# Patient Record
Sex: Female | Born: 1970 | Race: Black or African American | Hispanic: No | Marital: Single | Smoking: Never smoker
Health system: Southern US, Community
[De-identification: ages and names within clinical notes are randomized; demographics above are authoritative.]

## PROBLEM LIST (undated history)

## (undated) DIAGNOSIS — E119 Type 2 diabetes mellitus without complications: Secondary | ICD-10-CM

## (undated) DIAGNOSIS — F32A Depression, unspecified: Secondary | ICD-10-CM

## (undated) DIAGNOSIS — D649 Anemia, unspecified: Secondary | ICD-10-CM

## (undated) DIAGNOSIS — E785 Hyperlipidemia, unspecified: Secondary | ICD-10-CM

## (undated) DIAGNOSIS — F329 Major depressive disorder, single episode, unspecified: Secondary | ICD-10-CM

## (undated) DIAGNOSIS — N7011 Chronic salpingitis: Secondary | ICD-10-CM

## (undated) DIAGNOSIS — N920 Excessive and frequent menstruation with regular cycle: Secondary | ICD-10-CM

## (undated) HISTORY — DX: Hyperlipidemia, unspecified: E78.5

## (undated) HISTORY — DX: Chronic salpingitis: N70.11

## (undated) HISTORY — DX: Type 2 diabetes mellitus without complications: E11.9

## (undated) HISTORY — DX: Major depressive disorder, single episode, unspecified: F32.9

## (undated) HISTORY — DX: Anemia, unspecified: D64.9

## (undated) HISTORY — DX: Depression, unspecified: F32.A

## (undated) HISTORY — DX: Excessive and frequent menstruation with regular cycle: N92.0

## (undated) HISTORY — PX: OVARIAN CYST REMOVAL: SHX89

---

## 2007-03-18 HISTORY — PX: BREAST BIOPSY: SHX20

## 2012-02-14 ENCOUNTER — Emergency Department (HOSPITAL_COMMUNITY)
Admission: EM | Admit: 2012-02-14 | Discharge: 2012-02-14 | Disposition: A | Discharge status: Home / Self Care | Payer: No Typology Code available for payment source | Attending: Emergency Medicine | Admitting: Emergency Medicine

## 2012-02-14 ENCOUNTER — Emergency Department (HOSPITAL_COMMUNITY): Payer: No Typology Code available for payment source

## 2012-02-14 ENCOUNTER — Encounter (HOSPITAL_COMMUNITY): Payer: Self-pay | Admitting: Emergency Medicine

## 2012-02-14 DIAGNOSIS — Y9389 Activity, other specified: Secondary | ICD-10-CM | POA: Insufficient documentation

## 2012-02-14 DIAGNOSIS — S46909A Unspecified injury of unspecified muscle, fascia and tendon at shoulder and upper arm level, unspecified arm, initial encounter: Secondary | ICD-10-CM | POA: Insufficient documentation

## 2012-02-14 DIAGNOSIS — S335XXA Sprain of ligaments of lumbar spine, initial encounter: Secondary | ICD-10-CM | POA: Insufficient documentation

## 2012-02-14 DIAGNOSIS — S39012A Strain of muscle, fascia and tendon of lower back, initial encounter: Secondary | ICD-10-CM

## 2012-02-14 DIAGNOSIS — S161XXA Strain of muscle, fascia and tendon at neck level, initial encounter: Secondary | ICD-10-CM

## 2012-02-14 DIAGNOSIS — S4980XA Other specified injuries of shoulder and upper arm, unspecified arm, initial encounter: Secondary | ICD-10-CM | POA: Insufficient documentation

## 2012-02-14 DIAGNOSIS — S139XXA Sprain of joints and ligaments of unspecified parts of neck, initial encounter: Secondary | ICD-10-CM | POA: Insufficient documentation

## 2012-02-14 DIAGNOSIS — Y92410 Unspecified street and highway as the place of occurrence of the external cause: Secondary | ICD-10-CM | POA: Insufficient documentation

## 2012-02-14 MED ORDER — IBUPROFEN 400 MG PO TABS
800.0000 mg | ORAL_TABLET | Freq: Once | ORAL | Status: AC
  Administered 2012-02-14: 800 mg via ORAL
  Filled 2012-02-14: qty 2

## 2012-02-14 MED ORDER — HYDROCODONE-ACETAMINOPHEN 5-500 MG PO TABS: 1.0000 | ORAL_TABLET | Freq: Four times a day (QID) | ORAL | Status: DC | PRN

## 2012-02-14 MED ORDER — IBUPROFEN 600 MG PO TABS: 600.0000 mg | ORAL_TABLET | Freq: Four times a day (QID) | ORAL | Status: DC | PRN
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
## 2012-02-14 NOTE — ED Notes (Signed)
Patient discharged with her son instructions given using teach back method. Patient verbalized an understanding

## 2012-04-06 ENCOUNTER — Emergency Department (HOSPITAL_COMMUNITY)
Admission: EM | Admit: 2012-04-06 | Discharge: 2012-04-06 | Disposition: A | Discharge status: Home / Self Care | Payer: Self-pay | Attending: Emergency Medicine | Admitting: Emergency Medicine

## 2012-04-06 ENCOUNTER — Encounter (HOSPITAL_COMMUNITY): Payer: Self-pay | Admitting: Adult Health

## 2012-04-06 ENCOUNTER — Emergency Department (HOSPITAL_COMMUNITY): Payer: Self-pay

## 2012-04-06 DIAGNOSIS — R5381 Other malaise: Secondary | ICD-10-CM | POA: Insufficient documentation

## 2012-04-06 DIAGNOSIS — R209 Unspecified disturbances of skin sensation: Secondary | ICD-10-CM | POA: Insufficient documentation

## 2012-04-06 DIAGNOSIS — R200 Anesthesia of skin: Secondary | ICD-10-CM

## 2012-04-06 DIAGNOSIS — G44209 Tension-type headache, unspecified, not intractable: Secondary | ICD-10-CM | POA: Insufficient documentation

## 2012-04-06 DIAGNOSIS — Z7982 Long term (current) use of aspirin: Secondary | ICD-10-CM | POA: Insufficient documentation

## 2012-04-06 MED ORDER — KETOROLAC TROMETHAMINE 30 MG/ML IJ SOLN
30.0000 mg | Freq: Once | INTRAMUSCULAR | Status: AC
  Administered 2012-04-06: 30 mg via INTRAVENOUS
  Filled 2012-04-06: qty 1

## 2012-04-06 MED ORDER — LORAZEPAM 2 MG/ML IJ SOLN
1.0000 mg | Freq: Once | INTRAMUSCULAR | Status: AC
  Administered 2012-04-06: 1 mg via INTRAVENOUS
  Filled 2012-04-06: qty 1

## 2012-04-06 MED ORDER — DEXAMETHASONE SODIUM PHOSPHATE 10 MG/ML IJ SOLN
10.0000 mg | Freq: Once | INTRAMUSCULAR | Status: AC
  Administered 2012-04-06: 10 mg via INTRAVENOUS
  Filled 2012-04-06: qty 1

## 2012-04-06 MED ORDER — SODIUM CHLORIDE 0.9 % IV BOLUS (SEPSIS)
1000.0000 mL | Freq: Once | INTRAVENOUS | Status: AC
  Administered 2012-04-06: 1000 mL via INTRAVENOUS

## 2012-04-06 NOTE — ED Notes (Signed)
Pt states she has had a "tension headache" since 4 pm today.  Since this time, also c/o "numbness" in the right wrist, arm, and face.  HA is on the right side as well.  Denies photophobia.

## 2012-04-06 NOTE — ED Provider Notes (Signed)
History     CSN: [id number]  Arrival date & time 04/06/12  1843   First MD Initiated Contact with Patient 04/06/12 2048      Chief Complaint  Patient presents with  . Numbness    (Consider location/radiation/quality/duration/timing/severity/associated sxs/prior treatment) HPI Comments: 41 year old female presents emergency department complaining of right-sided facial numbness around 4:00 this afternoon while she was watching TV. The numbness lasted about 45 minutes and gradually progressed and her right arm and right leg which is still present. She describes the numbness in her arm and leg as as if they were asleep. Arm and leg are feeling a little weak. Admits to associated tension headache which she has a history of. Describes the headache as throbbing, rated 5 out of 5. She took an aspirin earlier with mild relief. Denies visual disturbance, dizziness, lightheadedness, nausea, vomiting, confusion or speech difficulty. Denies ever having any symptoms like this in the past. No personal or family history of stroke or heart attack.  The history is provided by the patient.    History reviewed. No pertinent past medical history.  History reviewed. No pertinent past surgical history.  History reviewed. No pertinent family history.  History  Substance Use Topics  . Smoking status: Never Smoker   . Smokeless tobacco: Not on file  . Alcohol Use: No    OB History    Grav Para Term Preterm Abortions TAB SAB Ect Mult Living                  Review of Systems  Constitutional: Negative for fever, chills and diaphoresis.  HENT: Negative for neck pain and neck stiffness.   Eyes: Negative for visual disturbance.  Respiratory: Negative for shortness of breath.   Cardiovascular: Negative for chest pain.  Gastrointestinal: Negative for nausea and vomiting.  Musculoskeletal: Negative for back pain.  Neurological: Positive for weakness, numbness and headaches. Negative for dizziness,  speech difficulty and light-headedness.  Psychiatric/Behavioral: Negative for confusion.  All other systems reviewed and are negative.    Allergies  Peanuts  Home Medications   Current Outpatient Rx  Name  Route  Sig  Dispense  Refill  . ASPIRIN PO   Oral   Take 1 tablet by mouth once.           BP 101/73  Pulse 63  Temp 98.2 F (36.8 C) (Oral)  Resp 16  SpO2 100%  Physical Exam  Nursing note and vitals reviewed. Constitutional: She is oriented to person, place, and time. She appears well-developed and well-nourished. No distress.  HENT:  Head: Normocephalic and atraumatic.  Mouth/Throat: Uvula is midline and oropharynx is clear and moist.  Eyes: Conjunctivae normal and EOM are normal. Pupils are equal, round, and reactive to light.  Neck: Normal range of motion. Neck supple.  Cardiovascular: Normal rate, regular rhythm, normal heart sounds and intact distal pulses.   No murmur heard. Pulmonary/Chest: Effort normal and breath sounds normal. No respiratory distress.  Abdominal: Soft. Bowel sounds are normal. There is no tenderness.  Musculoskeletal: Normal range of motion. She exhibits no edema.  Neurological: She is alert and oriented to person, place, and time. She has normal strength. No cranial nerve deficit or sensory deficit. She displays a negative Romberg sign. Coordination and gait normal.  Skin: Skin is warm and dry. She is not diaphoretic.  Psychiatric: She has a normal mood and affect. Her behavior is normal.    ED Course  Procedures (including critical care time)  Labs Reviewed -  No data to display Ct Head Wo Contrast  04/06/2012  *RADIOLOGY REPORT*  Clinical Data: Tension headache.  Right wrist, arm and face numbness.  CT HEAD WITHOUT CONTRAST  Technique:  Contiguous axial images were obtained from the base of the skull through the vertex without contrast.  Comparison: None.  Findings: There is no evidence of acute infarction, mass lesion, or intra- or  extra-axial hemorrhage on CT.  The posterior fossa, including the cerebellum, brainstem and fourth ventricle, is within normal limits.  The third and lateral ventricles, and basal ganglia are unremarkable in appearance.  The cerebral hemispheres are symmetric in appearance, with normal gray- white differentiation.  No mass effect or midline shift is seen.  There is no evidence of fracture; visualized osseous structures are unremarkable in appearance.  The visualized portions of the orbits are within normal limits.  The paranasal sinuses and mastoid air cells are well-aerated.  No significant soft tissue abnormalities are seen.  IMPRESSION: Unremarkable noncontrast CT of the head.   Original Report Authenticated By: Tonia Ghent, M.D.      1. Tension headache   2. Numbness       MDM  42 y/o female with tension headache and right sided facial/arm/leg numbness. Neuro exam unremarkable. CT head obtained which was normal. Headache and numbness completely subsided after receiving IV fluids, toradol, decadron and ativan. Patient stable for discharge. She is in NAD. Case discussed with Dr. Juleen China who agrees with plan of care. Return precautions discussed. Patient states understanding of plan and is agreeable.         Trevor Mace, PA-C 04/06/12 2251

## 2012-04-06 NOTE — ED Notes (Signed)
Rx x 0.  Pt voiced understanding to f/u with PCP and return for worsening condition.  

## 2012-04-06 NOTE — ED Notes (Signed)
Reports Right sided facial numbness that began at 4 pm today progressed to right arm, right leg. No drift, no droop, equal grips bilaterally, denies blurred vision and dizziness. Able to ambulate without difficulty, answers all questions appropriately. Pt reports tension headache. Denies chest pain and SOB.

## 2012-04-09 NOTE — ED Provider Notes (Signed)
Medical screening examination/treatment/procedure(s) were performed by non-physician practitioner and as supervising physician I was immediately available for consultation/collaboration.  Raeford Razor, MD 04/09/12 [phone]

## 2012-06-29 ENCOUNTER — Encounter (HOSPITAL_COMMUNITY): Payer: Self-pay | Admitting: *Deleted

## 2012-06-29 ENCOUNTER — Emergency Department (HOSPITAL_COMMUNITY)
Admission: EM | Admit: 2012-06-29 | Discharge: 2012-06-29 | Disposition: A | Discharge status: Home / Self Care | Payer: Medicare HMO | Attending: Emergency Medicine | Admitting: Emergency Medicine

## 2012-06-29 ENCOUNTER — Emergency Department (HOSPITAL_COMMUNITY): Payer: Medicare HMO

## 2012-06-29 DIAGNOSIS — Z7982 Long term (current) use of aspirin: Secondary | ICD-10-CM | POA: Insufficient documentation

## 2012-06-29 DIAGNOSIS — R0789 Other chest pain: Secondary | ICD-10-CM

## 2012-06-29 DIAGNOSIS — R071 Chest pain on breathing: Secondary | ICD-10-CM | POA: Insufficient documentation

## 2012-06-29 LAB — COMPREHENSIVE METABOLIC PANEL
ALT: 18 U/L (ref 0–35)
AST: 22 U/L (ref 0–37)
Albumin: 3.3 g/dL — ABNORMAL LOW (ref 3.5–5.2)
CO2: 27 mEq/L (ref 19–32)
Calcium: 8.8 mg/dL (ref 8.4–10.5)
Chloride: 105 mEq/L (ref 96–112)
GFR calc non Af Amer: 89 mL/min — ABNORMAL LOW (ref >90)
Sodium: 140 mEq/L (ref 135–145)

## 2012-06-29 LAB — CBC WITH DIFFERENTIAL/PLATELET
Basophils Absolute: 0 10*3/uL (ref 0.0–0.1)
Basophils Relative: 1 % (ref 0–1)
Eosinophils Relative: 3 % (ref 0–5)
Lymphocytes Relative: 44 % (ref 12–46)
MCHC: 31.9 g/dL (ref 30.0–36.0)
Neutro Abs: 1.9 10*3/uL (ref 1.7–7.7)
Platelets: 277 10*3/uL (ref 150–400)
RDW: 15.7 % — ABNORMAL HIGH (ref 11.5–15.5)
WBC: 4.4 10*3/uL (ref 4.0–10.5)

## 2012-06-29 MED ORDER — HYDROCODONE-ACETAMINOPHEN 5-325 MG PO TABS: 2.0000 | ORAL_TABLET | Freq: Three times a day (TID) | ORAL | Status: DC | PRN

## 2012-06-29 NOTE — ED Notes (Signed)
Patient returned from xray.

## 2012-06-29 NOTE — ED Notes (Signed)
Chest pain is in left breast, nonradiating.

## 2012-06-29 NOTE — ED Provider Notes (Signed)
History     CSN: [id number]  Arrival date & time 06/29/12  2128   First MD Initiated Contact with Patient 06/29/12 2215      Chief Complaint  Patient presents with  . Chest Pain    (Consider location/radiation/quality/duration/timing/severity/associated sxs/prior treatment) HPI This 42 year old healthy female has a 2-3 days of a constant well localized nonradiating sharp stabbing left anterior chest discomfort without radiation or associated symptoms such as fever cough shortness breath abdominal pain sweats vomiting bloody stools or other concerns. It is worse with palpation and nonexertional and nonpleuritic. There is no treatment prior to arrival other than aspirin today. She has had some mild nausea. PERC negative. History reviewed. No pertinent past medical history.  History reviewed. No pertinent past surgical history. Tubal ligation No family history on file.  History  Substance Use Topics  . Smoking status: Never Smoker   . Smokeless tobacco: Not on file  . Alcohol Use: No    OB History   Grav Para Term Preterm Abortions TAB SAB Ect Mult Living                  Review of Systems 10 Systems reviewed and are negative for acute change except as noted in the HPI. Allergies  Peanuts  Home Medications   Current Outpatient Rx  Name  Route  Sig  Dispense  Refill  . aspirin EC 81 MG tablet   Oral   Take 81 mg by mouth daily.         . diphenhydrAMINE (BENADRYL) 25 mg capsule   Oral   Take 25 mg by mouth every 6 (six) hours as needed for itching.         Marland Kitchen HYDROcodone-acetaminophen (NORCO) 5-325 MG per tablet   Oral   Take 2 tablets by mouth every 8 (eight) hours as needed for pain.   10 tablet   0     BP 108/64  Pulse 63  Temp(Src) 98.3 F (36.8 C) (Oral)  Resp 17  SpO2 99%  Physical Exam  Nursing note and vitals reviewed. Constitutional:  Awake, alert, nontoxic appearance.  HENT:  Head: Atraumatic.  Eyes: Right eye exhibits no discharge.  Left eye exhibits no discharge.  Neck: Neck supple.  Cardiovascular: Normal rate and regular rhythm.   No murmur heard. Pulmonary/Chest: Effort normal and breath sounds normal. No respiratory distress. She has no wheezes. She has no rales. She exhibits tenderness.  Exactly reproducible left parasternal anterior chest wall tenderness without rash  Abdominal: Soft. There is no tenderness. There is no rebound.  Musculoskeletal: She exhibits no edema and no tenderness.  Baseline ROM, no obvious new focal weakness.  Neurological:  Mental status and motor strength appears baseline for patient and situation.  Skin: No rash noted.  Psychiatric: She has a normal mood and affect.    ED Course  Procedures (including critical care time) ECG: Normal sinus rhythm, ventricular rate 86, normal axis, no acute ischemic changes noted, no comparison ECG immediately available  Patient informed of clinical course, understands medical decision-making process, and agree with plan.  Labs Reviewed  CBC WITH DIFFERENTIAL - Abnormal; Notable for the following:    RBC 3.71 (*)    Hemoglobin 9.4 (*)    HCT 29.5 (*)    MCH 25.3 (*)    RDW 15.7 (*)    All other components within normal limits  COMPREHENSIVE METABOLIC PANEL - Abnormal; Notable for the following:    Glucose, Bld 133 (*)  Albumin 3.3 (*)    Total Bilirubin 0.2 (*)    GFR calc non Af Amer 89 (*)    All other components within normal limits  TROPONIN I   Dg Chest 2 View  06/29/2012  *RADIOLOGY REPORT*  Clinical Data: Left-sided chest pain for 3 days.  CHEST - 2 VIEW  Comparison: None.  Findings: The lungs are well-aerated and clear.  There is no evidence of focal opacification, pleural effusion or pneumothorax.  The heart is normal in size; the mediastinal contour is within normal limits.  No acute osseous abnormalities are seen.  IMPRESSION: No acute cardiopulmonary process seen.   Original Report Authenticated By: Tonia Ghent, M.D.       1. Chest wall pain       MDM  I doubt any other [facility] precluding discharge at this time including, but not necessarily limited to the following:ACS, PE.        Hurman Horn, MD 06/30/12 [phone]

## 2012-06-29 NOTE — ED Notes (Signed)
The pt has had lt sided chest pain for 3 days with some intermittent sob and nausea/  No previous hsitory

## 2012-07-01 ENCOUNTER — Ambulatory Visit (INDEPENDENT_AMBULATORY_CARE_PROVIDER_SITE_OTHER): Payer: Medicare HMO | Admitting: Family Medicine

## 2012-07-01 ENCOUNTER — Encounter: Payer: Self-pay | Admitting: Family Medicine

## 2012-07-01 VITALS — BP 108/82 | HR 90 | Temp 98.5°F | Ht 65.75 in | Wt 234.0 lb

## 2012-07-01 DIAGNOSIS — D509 Iron deficiency anemia, unspecified: Secondary | ICD-10-CM

## 2012-07-01 DIAGNOSIS — N920 Excessive and frequent menstruation with regular cycle: Secondary | ICD-10-CM

## 2012-07-01 DIAGNOSIS — Z7689 Persons encountering health services in other specified circumstances: Secondary | ICD-10-CM

## 2012-07-01 DIAGNOSIS — F4323 Adjustment disorder with mixed anxiety and depressed mood: Secondary | ICD-10-CM

## 2012-07-01 DIAGNOSIS — E785 Hyperlipidemia, unspecified: Secondary | ICD-10-CM | POA: Insufficient documentation

## 2012-07-01 DIAGNOSIS — Z7189 Other specified counseling: Secondary | ICD-10-CM

## 2012-07-01 MED ORDER — FLUOXETINE HCL 20 MG PO TABS: 20.0000 mg | ORAL_TABLET | Freq: Every day | ORAL | Status: DC

## 2012-07-01 NOTE — Patient Instructions (Addendum)
-  We have ordered labs or studies at this visit. It can take up to 1-2 weeks for results and processing. We will contact you with instructions IF your results are abnormal. Normal results will be released to your [facility]. If you have not heard from [place] or can not find your results in [facility] in 2 weeks please contact our office.  -PLEASE SIGN UP FOR MYCHART TODAY   We recommend the following healthy lifestyle measures: - eat a healthy diet consisting of lots of vegetables, fruits, beans, nuts, seeds, healthy meats such as white chicken and fish and whole grains.  - avoid fried foods, fast food, processed foods, sodas, red meet and other fattening foods.  - get a least 150 minutes of aerobic exercise per week.   Start the prozac daily  Get counseling  Schedule appointment with gynecologist for your anemia and heavy bleeding  Take iron supplement daily  Follow up in: 1 month

## 2012-07-01 NOTE — Progress Notes (Signed)
Chief Complaint  Patient presents with  . Establish Care    HPI:  Amanda Bridges is here to establish care. Recently moved here from [place], [place].  Last PCP and physical: last physical with pap was in [place] in spring 2013, mammo oct 2012.  Has the following chronic problems and concerns today:  Patient Active Problem List  Diagnosis  . Anemia, iron deficiency  . Heavy menstrual bleeding  . Hyperlipemia  . Adjustment reaction with anxiety and depression   Wants to get basic labs - has hx of HLD, also has FH of diabetes and wants to check for diabetes too.  Has history of iron def anemia on and off chronically: -has very heavy menstrual bleeding -periods last 6 days, has to change large pads sometimes as soon as every 10 minutes, has clots as well -reports had workup in the past and has taken iron in the past -FDLMP: 06/26/11 - just finished menstrual cycle, periods monthly and regular, not spotting between periods -seen in ED for costochondritis a few days ago and Hgb 9.4 -denies: bowel issues, changes in bowels, blood in stools, no bleeding disorders in family or herself, palpitation, SOB, dizziness, easy bruising -wants to establish with gyn for treatment  Depression/Anxiety: -son murdered in 2013 -intermittent anxiety, heaviness, sadness, crying -no SI  Health Maintenance: -had health maintenance exam about 1 year ago, mammo 2 years ago  ROS: See pertinent positives and negatives per HPI.  Past Medical History  Diagnosis Date  . Depression   . Anemia   . Heavy menstrual bleeding   . Hyperlipidemia     Family History  Problem Relation Age of Onset  . Cancer Mother     lung cancer, smoker  . Diabetes Mother   . Diabetes Maternal Aunt   . Cancer Maternal Aunt     stomach cancer  . Diabetes Maternal Grandmother     History   Social History  . Marital Status: Single    Spouse Name: N/A    Number of Children: N/A  . Years of Education: N/A   Social  History Main Topics  . Smoking status: Never Smoker   . Smokeless tobacco: None  . Alcohol Use: No  . Drug Use: No  . Sexually Active: Yes     Comment: with spouse   Other Topics Concern  . None   Social History Narrative   Work or School: Estate agent in [employer] Situation: lives with spouse and 3 children, lost oldest son to murder in 2013      Spiritual Beliefs: Christian      Lifestyle: no regular exercise, diet is poor                Current outpatient prescriptions:FLUoxetine (PROZAC) 20 MG tablet, Take 1 tablet (20 mg total) by mouth daily., Disp: 30 tablet, Rfl: 3  EXAM:  Filed Vitals:   07/01/12 1639  BP: 108/82  Pulse: 90  Temp: 98.5 F (36.9 C)    Body mass index is 38.06 kg/(m^2).  GENERAL: vitals reviewed and listed above, alert, oriented, appears well hydrated and in no acute distress  HEENT: atraumatic, conjunttiva clear, no obvious abnormalities on inspection of external nose and ears  NECK: no obvious masses on inspection  LUNGS: clear to auscultation bilaterally, no wheezes, rales or rhonchi, good air movement  CV: HRRR, no peripheral edema  MS: moves all extremities without noticeable abnormality  PSYCH: pleasant and cooperative, depressed mood  ASSESSMENT  AND PLAN:  Discussed the following assessment and plan:  Anemia, iron deficiency - Plan: Anemia Panel, CANCELED: CBC with Differential, CANCELED: TSH, CANCELED: CMP, CANCELED: CBC with Differential, CANCELED: Comprehensive metabolic panel, CANCELED: TSH  Heavy menstrual bleeding  Hyperlipemia - Plan: CANCELED: Lipid Panel, CANCELED: Lipid panel  Adjustment reaction with anxiety and depression - Plan: FLUoxetine (PROZAC) 20 MG tablet  Encounter to establish care - Plan: CANCELED: Hemoglobin A1c, CANCELED: Hemoglobin A1c  -We reviewed the PMH, PSH, FH, SH, Meds and Allergies. -Starting prozac for anxiety and depression after discussion options, risks/benefits -  also advised regular exercise and counseling and number given for counseling and for bereavement support -Pt would like to return for FASTING LABS next week - see orders -She will see gyn for her very heavy menstrual bleeding for tx as this most likely cause of her anemia -Discussed other causes of anemia and if persists after tx heavy menstrual bleeding will have her see GI -Follow up 1 month   -Patient advised to return or notify a doctor immediately if symptoms worsen or persist or new concerns arise.  Patient Instructions  -We have ordered labs or studies at this visit. It can take up to 1-2 weeks for results and processing. We will contact you with instructions IF your results are abnormal. Normal results will be released to your [facility]. If you have not heard from [place] or can not find your results in [facility] in 2 weeks please contact our office.  -PLEASE SIGN UP FOR MYCHART TODAY   We recommend the following healthy lifestyle measures: - eat a healthy diet consisting of lots of vegetables, fruits, beans, nuts, seeds, healthy meats such as white chicken and fish and whole grains.  - avoid fried foods, fast food, processed foods, sodas, red meet and other fattening foods.  - get a least 150 minutes of aerobic exercise per week.   Start the prozac daily  Get counseling  Schedule appointment with gynecologist for your anemia and heavy bleeding  Take iron supplement daily  Follow up in: 1 month      Laryssa Hassing R.

## 2012-07-09 ENCOUNTER — Other Ambulatory Visit (INDEPENDENT_AMBULATORY_CARE_PROVIDER_SITE_OTHER): Payer: Medicare HMO

## 2012-07-09 DIAGNOSIS — D509 Iron deficiency anemia, unspecified: Secondary | ICD-10-CM

## 2012-07-10 LAB — ANEMIA PANEL
%SAT: 7 % — ABNORMAL LOW (ref 20–55)
Ferritin: 17 ng/mL (ref 10–291)
Iron: 34 ug/dL — ABNORMAL LOW (ref 42–145)
RBC.: 4.03 MIL/uL (ref 3.87–5.11)
Vitamin B-12: 398 pg/mL (ref 211–911)

## 2012-07-13 ENCOUNTER — Telehealth: Payer: Self-pay

## 2012-07-13 NOTE — Telephone Encounter (Signed)
Per Dr. Elmyra Ricks request called pt to advised that when she had blood work done most of the labs were somehow cancelled. Called pt to see if she wanted to come in no appt needed anytime for labs to see Amanda Bridges or have pt make upcoming appt for 5/15.

## 2012-07-14 NOTE — Telephone Encounter (Signed)
Returned pt's call and left a message for pt to return call to set up appt or to come fasting for next appt scheduled to have labs done.

## 2012-07-26 ENCOUNTER — Ambulatory Visit: Payer: Self-pay | Admitting: Gynecology

## 2012-07-29 ENCOUNTER — Telehealth: Payer: Self-pay | Admitting: Family Medicine

## 2012-07-29 ENCOUNTER — Ambulatory Visit (INDEPENDENT_AMBULATORY_CARE_PROVIDER_SITE_OTHER): Payer: Managed Care, Other (non HMO) | Admitting: Family Medicine

## 2012-07-29 ENCOUNTER — Encounter: Payer: Self-pay | Admitting: Family Medicine

## 2012-07-29 ENCOUNTER — Ambulatory Visit: Payer: Medicare HMO | Admitting: Family Medicine

## 2012-07-29 VITALS — BP 116/82 | HR 72 | Temp 98.2°F | Wt 234.0 lb

## 2012-07-29 DIAGNOSIS — N920 Excessive and frequent menstruation with regular cycle: Secondary | ICD-10-CM

## 2012-07-29 DIAGNOSIS — E669 Obesity, unspecified: Secondary | ICD-10-CM

## 2012-07-29 DIAGNOSIS — D509 Iron deficiency anemia, unspecified: Secondary | ICD-10-CM

## 2012-07-29 DIAGNOSIS — F4323 Adjustment disorder with mixed anxiety and depressed mood: Secondary | ICD-10-CM

## 2012-07-29 DIAGNOSIS — E785 Hyperlipidemia, unspecified: Secondary | ICD-10-CM

## 2012-07-29 LAB — TSH: TSH: 0.89 u[IU]/mL (ref 0.35–5.50)

## 2012-07-29 LAB — CBC WITH DIFFERENTIAL/PLATELET
Eosinophils Relative: 1.4 % (ref 0.0–5.0)
Lymphocytes Relative: 36.7 % (ref 12.0–46.0)
Monocytes Relative: 7.9 % (ref 3.0–12.0)
Neutrophils Relative %: 53.5 % (ref 43.0–77.0)
Platelets: 318 10*3/uL (ref 150.0–400.0)
WBC: 4.8 10*3/uL (ref 4.5–10.5)

## 2012-07-29 LAB — COMPREHENSIVE METABOLIC PANEL
ALT: 22 U/L (ref 0–35)
CO2: 27 mEq/L (ref 19–32)
Calcium: 9.2 mg/dL (ref 8.4–10.5)
Chloride: 105 mEq/L (ref 96–112)
GFR: 105.73 mL/min (ref >60.00)
Potassium: 3.9 mEq/L (ref 3.5–5.1)
Sodium: 137 mEq/L (ref 135–145)
Total Bilirubin: 0.3 mg/dL (ref 0.3–1.2)
Total Protein: 6.9 g/dL (ref 6.0–8.3)

## 2012-07-29 LAB — LIPID PANEL: VLDL: 17.4 mg/dL (ref 0.0–40.0)

## 2012-07-29 MED ORDER — FLUOXETINE HCL 20 MG PO TABS: 20.0000 mg | ORAL_TABLET | Freq: Every day | ORAL | Status: DC

## 2012-07-29 NOTE — Patient Instructions (Addendum)
-  We have ordered labs or studies at this visit. It can take up to 1-2 weeks for results and processing. We will contact you with instructions IF your results are abnormal. Normal results will be released to your [facility]. If you have not heard from [place] or can not find your results in [facility] in 2 weeks please contact our office.   Follow up in 3-4 months

## 2012-07-29 NOTE — Telephone Encounter (Signed)
Labs look good except for known anemia which has improved a little from last check. Blood sugar, thyroid, cholesterol look ok other then low good cholesterol. Regular exercise, healthy diet and treatment of heavy menstrual bleeding with gyn advised.

## 2012-07-29 NOTE — Progress Notes (Signed)
Chief Complaint  Patient presents with  . Follow-up    1 mth    HPI:  Follow up:  Anx/Dep: -lost oldest son to murder recently -started prozac one month ago and advised counseling - has not seen a counselor -pt reports currently: doing well, feels like mood has improved, feels chirpier -denies: no SI or thoughts of self harm  HLD: -needs lipid panel today -denies: CP, SOB, polyuria, polydipsea -has started going to the gym, working on diet  Menorrhagia/iron def anemai: -was to see gyn - she has appointment wednesday  Labs: ordered last visit, but when pt came for labs lab cancelled most orders ROS: See pertinent positives and negatives per HPI.  Past Medical History  Diagnosis Date  . Depression   . Anemia   . Heavy menstrual bleeding   . Hyperlipidemia     Family History  Problem Relation Age of Onset  . Cancer Mother     lung cancer, smoker  . Diabetes Mother   . Diabetes Maternal Aunt   . Cancer Maternal Aunt     stomach cancer  . Diabetes Maternal Grandmother     History   Social History  . Marital Status: Single    Spouse Name: N/A    Number of Children: N/A  . Years of Education: N/A   Social History Main Topics  . Smoking status: Never Smoker   . Smokeless tobacco: None  . Alcohol Use: No  . Drug Use: No  . Sexually Active: Yes     Comment: with spouse   Other Topics Concern  . None   Social History Narrative   Work or School: Estate agent in [employer] Situation: lives with spouse and 3 children, lost oldest son to murder in 2013      Spiritual Beliefs: Christian      Lifestyle: no regular exercise, diet is poor                Current outpatient prescriptions:FLUoxetine (PROZAC) 20 MG tablet, Take 1 tablet (20 mg total) by mouth daily., Disp: 30 tablet, Rfl: 3  EXAM:  Filed Vitals:   07/29/12 0857  BP: 116/82  Pulse: 72  Temp: 98.2 F (36.8 C)    Body mass index is 38.06 kg/(m^2).  GENERAL: vitals  reviewed and listed above, alert, oriented, appears well hydrated and in no acute distress  HEENT: atraumatic, conjunttiva clear, no obvious abnormalities on inspection of external nose and ears  NECK: no obvious masses on inspection  LUNGS: clear to auscultation bilaterally, no wheezes, rales or rhonchi, good air movement  CV: HRRR, no peripheral edema  MS: moves all extremities without noticeable abnormality  PSYCH: pleasant and cooperative, no obvious depression or anxiety  ASSESSMENT AND PLAN:  Discussed the following assessment and plan:  Hyperlipemia - Plan: CMP, Lipid Panel  Anemia, iron deficiency - Plan: TSH  Heavy menstrual bleeding - Plan: CBC with Differential, TSH  Obesity  Adjustment reaction with anxiety and depression  -continue prozac, counseling - doing well -FASTING LABS: CBC, CMP, TSH, HgbA1c, Lipid panel -see gyn for heavy menstrual bleeding this week - she will get physical and mammo there -follow up 3-4 months or sooner if concerns  -Patient advised to return or notify a doctor immediately if symptoms worsen or persist or new concerns arise.  Patient Instructions  -We have ordered labs or studies at this visit. It can take up to 1-2 weeks for results and processing. We will  contact you with instructions IF your results are abnormal. Normal results will be released to your [facility]. If you have not heard from [place] or can not find your results in [facility] in 2 weeks please contact our office.   Follow up in 3-4 months         Winston Sobczyk R.

## 2012-07-30 NOTE — Telephone Encounter (Signed)
Called and spoke with pt and pt is aware.  

## 2012-07-30 NOTE — Telephone Encounter (Signed)
Left a message for pt to return call 

## 2012-08-04 ENCOUNTER — Ambulatory Visit (INDEPENDENT_AMBULATORY_CARE_PROVIDER_SITE_OTHER): Payer: Managed Care, Other (non HMO) | Admitting: Gynecology

## 2012-08-04 ENCOUNTER — Encounter: Payer: Self-pay | Admitting: Gynecology

## 2012-08-04 ENCOUNTER — Other Ambulatory Visit (HOSPITAL_COMMUNITY)
Admission: RE | Admit: 2012-08-04 | Discharge: 2012-08-04 | Disposition: A | Discharge status: Home / Self Care | Payer: Managed Care, Other (non HMO) | Source: Ambulatory Visit | Attending: Gynecology | Admitting: Gynecology

## 2012-08-04 VITALS — BP 124/78 | Ht 65.75 in | Wt 241.0 lb

## 2012-08-04 DIAGNOSIS — D259 Leiomyoma of uterus, unspecified: Secondary | ICD-10-CM

## 2012-08-04 DIAGNOSIS — A599 Trichomoniasis, unspecified: Secondary | ICD-10-CM

## 2012-08-04 DIAGNOSIS — Z113 Encounter for screening for infections with a predominantly sexual mode of transmission: Secondary | ICD-10-CM

## 2012-08-04 DIAGNOSIS — L293 Anogenital pruritus, unspecified: Secondary | ICD-10-CM

## 2012-08-04 DIAGNOSIS — D649 Anemia, unspecified: Secondary | ICD-10-CM

## 2012-08-04 DIAGNOSIS — N920 Excessive and frequent menstruation with regular cycle: Secondary | ICD-10-CM

## 2012-08-04 DIAGNOSIS — Z01419 Encounter for gynecological examination (general) (routine) without abnormal findings: Secondary | ICD-10-CM

## 2012-08-04 DIAGNOSIS — Z1151 Encounter for screening for human papillomavirus (HPV): Secondary | ICD-10-CM | POA: Insufficient documentation

## 2012-08-04 DIAGNOSIS — L292 Pruritus vulvae: Secondary | ICD-10-CM

## 2012-08-04 LAB — WET PREP FOR TRICH, YEAST, CLUE: Yeast Wet Prep HPF POC: NONE SEEN

## 2012-08-04 MED ORDER — TINIDAZOLE 500 MG PO TABS: ORAL_TABLET | ORAL | Status: DC

## 2012-08-04 NOTE — Patient Instructions (Signed)
Trichomoniasis Trichomoniasis is an infection, caused by the Trichomonas organism, that affects both women and men. In women, the outer female genitalia and the vagina are affected. In men, the penis is mainly affected, but the prostate and other reproductive organs can also be involved. Trichomoniasis is a sexually transmitted disease (STD) and is most often passed to another person through sexual contact. The majority of people who get trichomoniasis do so from a sexual encounter and are also at risk for other STDs. CAUSES   Sexual intercourse with an infected partner.  It can be present in swimming pools or hot tubs. SYMPTOMS   Abnormal gray-green frothy vaginal discharge in women.  Vaginal itching and irritation in women.  Itching and irritation of the area outside the vagina in women.  Penile discharge with or without pain in males.  Inflammation of the urethra (urethritis), causing painful urination.  Bleeding after sexual intercourse. RELATED COMPLICATIONS  Pelvic inflammatory disease.  Infection of the uterus (endometritis).  Infertility.  Tubal (ectopic) pregnancy.  It can be associated with other STDs, including gonorrhea and chlamydia, hepatitis B, and HIV. COMPLICATIONS DURING PREGNANCY  Early (premature) delivery.  Premature rupture of the membranes (PROM).  Low birth weight. DIAGNOSIS   Visualization of Trichomonas under the microscope from the vagina discharge.  Ph of the vagina greater than 4.5, tested with a test tape.  Trich Rapid Test.  Culture of the organism, but this is not usually needed.  It may be found on a Pap test.  Having a "strawberry cervix,"which means the cervix looks very red like a strawberry. TREATMENT   You may be given medication to fight the infection. Inform your caregiver if you could be or are pregnant. Some medications used to treat the infection should not be taken during pregnancy.  Over-the-counter medications or  creams to decrease itching or irritation may be recommended.  Your sexual partner will need to be treated if infected. HOME CARE INSTRUCTIONS   Take all medication prescribed by your caregiver.  Take over-the-counter medication for itching or irritation as directed by your caregiver.  Do not have sexual intercourse while you have the infection.  Do not douche or wear tampons.  Discuss your infection with your partner, as your partner may have acquired the infection from you. Or, your partner may have been the person who transmitted the infection to you.  Have your sex partner examined and treated if necessary.  Practice safe, informed, and protected sex.  See your caregiver for other STD testing. SEEK MEDICAL CARE IF:   You still have symptoms after you finish the medication.  You have an oral temperature above 102 F (38.9 C).  You develop belly (abdominal) pain.  You have pain when you urinate.  You have bleeding after sexual intercourse.  You develop a rash.  The medication makes you sick or makes you throw up (vomit). Document Released: 08/27/2000 Document Revised: 05/26/2011 Document Reviewed: 09/22/2008 [facility] Patient Information 2014 [place], [place].  Tinidazole tablets What is this medicine? TINIDAZOLE (tye NI da zole) is an antiinfective. It is used to treat amebiasis, giardiasis, trichomoniasis, and vaginosis. It will not work for colds, flu, or other viral infections. This medicine may be used for other purposes; ask your health care provider or pharmacist if you have questions. What should I tell my health care provider before I take this medicine? They need to know if you have any of these conditions: -anemia or other blood disorders -if you frequently drink alcohol containing drinks -  receiving hemodialysis -seizure disorder -an unusual or allergic reaction to tinidazole, other medicines, foods, dyes, or preservatives -pregnant or trying to get  pregnant -breast-feeding How should I use this medicine? Take this medicine by mouth with a full glass of water. Follow the directions on the prescription label. Take with food. Take your medicine at regular intervals. Do not take your medicine more often than directed. Take all of your medicine as directed even if you think you are better. Do not skip doses or stop your medicine early. Talk to your pediatrician regarding the use of this medicine in children. While this drug may be prescribed for children as young as 15 years of age for selected conditions, precautions do apply. Overdosage: If you think you have taken too much of this medicine contact a poison control center or emergency room at once. NOTE: This medicine is only for you. Do not share this medicine with others. What if I miss a dose? If you miss a dose, take it as soon as you can. If it is almost time for your next dose, take only that dose. Do not take double or extra doses. What may interact with this medicine? Do not take this medicine with any of the following medications: -alcohol or any product that contains alcohol -amprenavir oral solution -disulfiram -paclitaxel injection -ritonavir oral solution -sertraline oral solution -sulfamethoxazole-trimethoprim injection This medicine may also interact with the following medications: -cholestyramine -cimetidine -conivaptan -cyclosporin -fluorouracil -fosphenytoin, phenytoin -ketoconazole -lithium -phenobarbital -tacrolimus -warfarin This list may not describe all possible interactions. Give your health care provider a list of all the medicines, herbs, non-prescription drugs, or dietary supplements you use. Also tell them if you smoke, drink alcohol, or use illegal drugs. Some items may interact with your medicine. What should I watch for while using this medicine? Tell your doctor or health care professional if your symptoms do not improve or if they get worse. Avoid  alcoholic drinks while you are taking this medicine and for three days afterward. Alcohol may make you feel dizzy, sick, or flushed. If you are being treated for a sexually transmitted disease, avoid sexual contact until you have finished your treatment. Your sexual partner may also need treatment. What side effects may I notice from receiving this medicine? Side effects that you should report to your doctor or health care professional as soon as possible: -allergic reactions like skin rash, itching or hives, swelling of the face, lips, or tongue -breathing problems -confusion, depression -dark or white patches in the mouth -feeling faint or lightheaded, falls -fever, infection -numbness, tingling, pain or weakness in the hands or feet -pain when passing urine -seizures -unusually weak or tired -vaginal irritation or discharge -vomiting Side effects that usually do not require medical attention (report to your doctor or health care professional if they continue or are bothersome): -dark [place] or reddish urine -diarrhea -headache -loss of appetite -metallic taste -nausea -stomach upset This list may not describe all possible side effects. Call your doctor for medical advice about side effects. You may report side effects to FDA at 1-800-FDA-1088. Where should I keep my medicine? Keep out of the reach of children. Store at room temperature between 15 and 30 degrees C (59 and 86 degrees F). Protect from light and moisture. Keep container tightly closed. Throw away any unused medicine after the expiration date. NOTE: This sheet is a summary. It may not cover all possible information. If you have questions about this medicine, talk to your doctor, pharmacist, or  health care provider.  2013, Elsevier/[employer] Standard. (11/29/2007 3:22:28 PM)

## 2012-08-04 NOTE — Progress Notes (Signed)
Amanda Bridges [birth year] [id number]   History:    42 y.o.  for annual gyn exam new patient to the practice who moved here from [facility]. Patient states that for the past 4 months her cycles have been regular but they're very heavy she passes large clots the first 2-3 days and total days of her menses last 5 days. Patient stated she had a tubal sterilization procedure several years ago. Her mammogram was also 3 years ago reportedly normal. Patient denies any prior history of abnormal Pap smears. Patient's primary physician recently put on R. Supplementation due to the fact she had anemia. Patient was complaining of a slight vaginal discharge and vulvar pruritus.  Past medical history,surgical history, family history and social history were all reviewed and documented in the EPIC chart.  Gynecologic History Patient's last menstrual period was 07/24/2012. Contraception: tubal ligation Last Pap: 2012. Results were: normal Last mammogram: 3 years ago. Results were: patient reports that was normal in [facility]  Obstetric History OB History   Grav Para Term Preterm Abortions TAB SAB Ect Mult Living   5 5        5      # Outc Date GA Lbr Len/2nd Wgt Sex Del Anes PTL Lv   1 PAR            2 PAR            3 PAR            4 PAR            5 PAR                ROS: A ROS was performed and pertinent positives and negatives are included in the history.  GENERAL: No fevers or chills. HEENT: No change in vision, no earache, sore throat or sinus congestion. NECK: No pain or stiffness. CARDIOVASCULAR: No chest pain or pressure. No palpitations. PULMONARY: No shortness of breath, cough or wheeze. GASTROINTESTINAL: No abdominal pain, nausea, vomiting or diarrhea, melena or bright red blood per rectum. GENITOURINARY: No urinary frequency, urgency, hesitancy or dysuria. MUSCULOSKELETAL: No joint or muscle pain, no back pain, no recent trauma. DERMATOLOGIC: No rash, no itching,  no lesions. ENDOCRINE: No polyuria, polydipsia, no heat or cold intolerance. No recent change in weight. HEMATOLOGICAL: No anemia or easy bruising or bleeding. NEUROLOGIC: No headache, seizures, numbness, tingling or weakness. PSYCHIATRIC: No depression, no loss of interest in normal activity or change in sleep pattern.     Exam: chaperone present  BP 124/78  Ht 5' 5.75" (1.67 m)  Wt 241 lb (109.317 kg)  BMI 39.2 kg/m2  LMP 07/24/2012  Body mass index is 39.2 kg/(m^2).  General appearance : Well developed well nourished female. No acute distress HEENT: Neck supple, trachea midline, no carotid bruits, no thyroidmegaly Lungs: Clear to auscultation, no rhonchi or wheezes, or rib retractions  Heart: Regular rate and rhythm, no murmurs or gallops Breast:Examined in sitting and supine position were symmetrical in appearance, no palpable masses or tenderness,  no skin retraction, no nipple inversion, no nipple discharge, no skin discoloration, no axillary or supraclavicular lymphadenopathy Abdomen: no palpable masses or tenderness, no rebound or guarding Extremities: no edema or skin discoloration or tenderness  Pelvic:  Bartholin, Urethra, Skene Glands: Within normal limits             Vagina: cream each-like discharge with very hyperemic vaginal mucosa  Cervix: No gross lesions or discharge  Uterus  anteverted, normal size, shape and consistency, non-tender and mobile  Adnexa  Without masses or tenderness  Anus and perineum  normal   Rectovaginal  normal sphincter tone without palpated masses or tenderness             Hemoccult Not indicated   Wet prep: Trichomoniasis , moderate clue cell, many white blood cell, too numerous to count bacteria,Pos Amine  GC and Chlamydia culture obtained pending at time of this dictation  Assessment/Plan:  42 y.o. female for annual exam with history of menorrhagia contributing to her anemia. Wet prep today demonstrated evidence of trichomoniasis. She  will be prescribed Tindamax 2000 mg today. Her husband needs to be treated as well. To complete the STD screen she will stop by the lab and we will draw an HIV, RPR, hepatitis B and C. She will schedule her mammogram since is overdue. We will see her after next cycle to do a sonohysterogram to better assess her uterine cavity because of her menorrhagia and past history of uterine fibroids. Literature information on the Mirena IUD as well as her option had been provided as well. She will be prescribed also Lysteda 1300 mg 3 times a day for 5 days for her menorrhagia.    Ok Edwards MD, 5:12 PM 08/04/2012

## 2012-08-05 ENCOUNTER — Telehealth: Payer: Self-pay | Admitting: *Deleted

## 2012-08-05 LAB — HEPATITIS B SURFACE ANTIGEN: Hepatitis B Surface Ag: NEGATIVE

## 2012-08-05 LAB — GC/CHLAMYDIA PROBE AMP: CT Probe RNA: NEGATIVE

## 2012-08-05 MED ORDER — TRANEXAMIC ACID 650 MG PO TABS: ORAL_TABLET | ORAL | Status: DC

## 2012-08-05 NOTE — Telephone Encounter (Signed)
Message copied by Aura Camps on Thu Aug 05, 2012  8:58 AM ------      Message from: Ok Edwards      Created: Wed Aug 04, 2012  5:18 PM       Victorino Dike, this patient had left and I forgot to call her in prescription for Lysteda 650 mg which I would like her to take 3 tablets 3 times a day during her menses to cut down on her heavy bleeding. #30 refill x11 ------

## 2012-08-05 NOTE — Telephone Encounter (Signed)
Rx sent to pharmacy, pt informed as well.

## 2012-08-13 ENCOUNTER — Other Ambulatory Visit: Payer: Self-pay | Admitting: Family Medicine

## 2012-08-13 DIAGNOSIS — Z1231 Encounter for screening mammogram for malignant neoplasm of breast: Secondary | ICD-10-CM

## 2012-08-18 ENCOUNTER — Ambulatory Visit (HOSPITAL_COMMUNITY)
Admission: RE | Admit: 2012-08-18 | Discharge: 2012-08-18 | Disposition: A | Discharge status: Home / Self Care | Payer: Managed Care, Other (non HMO) | Source: Ambulatory Visit | Attending: Family Medicine | Admitting: Family Medicine

## 2012-08-18 DIAGNOSIS — Z1231 Encounter for screening mammogram for malignant neoplasm of breast: Secondary | ICD-10-CM | POA: Insufficient documentation

## 2012-08-19 ENCOUNTER — Other Ambulatory Visit: Payer: Self-pay | Admitting: Gynecology

## 2012-08-19 DIAGNOSIS — N920 Excessive and frequent menstruation with regular cycle: Secondary | ICD-10-CM

## 2012-08-19 DIAGNOSIS — D259 Leiomyoma of uterus, unspecified: Secondary | ICD-10-CM

## 2012-09-01 ENCOUNTER — Ambulatory Visit (INDEPENDENT_AMBULATORY_CARE_PROVIDER_SITE_OTHER): Payer: Managed Care, Other (non HMO) | Admitting: Gynecology

## 2012-09-01 ENCOUNTER — Other Ambulatory Visit: Payer: Self-pay | Admitting: Family Medicine

## 2012-09-01 ENCOUNTER — Ambulatory Visit (INDEPENDENT_AMBULATORY_CARE_PROVIDER_SITE_OTHER): Payer: Managed Care, Other (non HMO)

## 2012-09-01 DIAGNOSIS — R928 Other abnormal and inconclusive findings on diagnostic imaging of breast: Secondary | ICD-10-CM

## 2012-09-01 DIAGNOSIS — D251 Intramural leiomyoma of uterus: Secondary | ICD-10-CM

## 2012-09-01 DIAGNOSIS — N852 Hypertrophy of uterus: Secondary | ICD-10-CM

## 2012-09-01 DIAGNOSIS — N83202 Unspecified ovarian cyst, left side: Secondary | ICD-10-CM

## 2012-09-01 DIAGNOSIS — A599 Trichomoniasis, unspecified: Secondary | ICD-10-CM

## 2012-09-01 DIAGNOSIS — D649 Anemia, unspecified: Secondary | ICD-10-CM

## 2012-09-01 DIAGNOSIS — N920 Excessive and frequent menstruation with regular cycle: Secondary | ICD-10-CM

## 2012-09-01 DIAGNOSIS — N831 Corpus luteum cyst of ovary, unspecified side: Secondary | ICD-10-CM

## 2012-09-01 DIAGNOSIS — N7011 Chronic salpingitis: Secondary | ICD-10-CM

## 2012-09-01 DIAGNOSIS — D259 Leiomyoma of uterus, unspecified: Secondary | ICD-10-CM

## 2012-09-01 DIAGNOSIS — N7013 Chronic salpingitis and oophoritis: Secondary | ICD-10-CM

## 2012-09-01 DIAGNOSIS — N83209 Unspecified ovarian cyst, unspecified side: Secondary | ICD-10-CM

## 2012-09-01 HISTORY — DX: Chronic salpingitis: N70.11

## 2012-09-01 LAB — WET PREP FOR TRICH, YEAST, CLUE
Trich, Wet Prep: NONE SEEN
Yeast Wet Prep HPF POC: NONE SEEN

## 2012-09-01 MED ORDER — MEDROXYPROGESTERONE ACETATE 150 MG/ML IM SUSP
150.0000 mg | Freq: Once | INTRAMUSCULAR | Status: AC
  Administered 2012-09-01: 150 mg via INTRAMUSCULAR

## 2012-09-01 MED ORDER — TRANEXAMIC ACID 650 MG PO TABS: ORAL_TABLET | ORAL | Status: DC

## 2012-09-01 MED ORDER — DOXYCYCLINE HYCLATE 100 MG PO CAPS: 100.0000 mg | ORAL_CAPSULE | Freq: Two times a day (BID) | ORAL | Status: DC

## 2012-09-01 NOTE — Patient Instructions (Addendum)
Ovarian Cyst  The ovaries are small organs that are on each side of the uterus. The ovaries are the organs that produce the female hormones, estrogen and progesterone. An ovarian cyst is a sac filled with fluid that can vary in its size. It is normal for a small cyst to form in women who are in the childbearing age and who have menstrual periods. This type of cyst is called a follicle cyst that becomes an ovulation cyst (corpus luteum cyst) after it produces the women's egg. It later goes away on its own if the woman does not become pregnant. There are other kinds of ovarian cysts that may cause problems and may need to be treated. The most serious problem is a cyst with cancer. It should be noted that menopausal women who have an ovarian cyst are at a higher risk of it being a cancer cyst. They should be evaluated very quickly, thoroughly and followed closely. This is especially true in menopausal women because of the high rate of ovarian cancer in women in menopause.  CAUSES AND TYPES OF OVARIAN CYSTS:   FUNCTIONAL CYST: The follicle/corpus luteum cyst is a functional cyst that occurs every month during ovulation with the menstrual cycle. They go away with the next menstrual cycle if the woman does not get pregnant. Usually, there are no symptoms with a functional cyst.   ENDOMETRIOMA CYST: This cyst develops from the lining of the uterus tissue. This cyst gets in or on the ovary. It grows every month from the bleeding during the menstrual period. It is also called a "chocolate cyst" because it becomes filled with blood that turns [place]gill[place]ray. This cyst can cause pain in the lower abdomen during intercourse and with your menstrual period.   CYSTADENOMA CYST: This cyst develops from the cells on the outside of the ovary. They usually are not cancerous. They can get very big and cause lower abdomen pain and pain with intercourse. This type of cyst can twist on itself, cut off its blood supply and cause severe pain. It  also can easily rupture and cause a lot of pain.   DERMOID CYST: This type of cyst is sometimes found in both ovaries. They are found to have different kinds of body tissue in the cyst. The tissue includes skin, teeth, hair, and/or cartilage. They usually do not have symptoms unless they get very big. Dermoid cysts are rarely cancerous.   POLYCYSTIC OVARY: This is a rare condition with hormone problems that produces many small cysts on both ovaries. The cysts are follicle-like cysts that never produce an egg and become a corpus luteum. It can cause an increase in body weight, infertility, acne, increase in body and facial hair and lack of menstrual periods or rare menstrual periods. Many women with this problem develop type 2 diabetes. The exact cause of this problem is unknown. A polycystic ovary is rarely cancerous.   THECA LUTEIN CYST: Occurs when too much hormone (human chorionic gonadotropin) is produced and over-stimulates the ovaries to produce an egg. They are frequently seen when doctors stimulate the ovaries for invitro-fertilization (test tube babies).   LUTEOMA CYST: This cyst is seen during pregnancy. Rarely it can cause an obstruction to the birth canal during labor and delivery. They usually go away after delivery.  SYMPTOMS    Pelvic pain or pressure.   Pain during sexual intercourse.   Increasing girth (swelling) of the abdomen.   Abnormal menstrual periods.   Increasing pain with menstrual periods.     You stop having menstrual periods and you are not pregnant.  DIAGNOSIS   The diagnosis can be made during:   Routine or annual pelvic examination (common).   Ultrasound.   X-ray of the pelvis.   CT Scan.   MRI.   Blood tests.  TREATMENT    Treatment may only be to follow the cyst monthly for 2 to 3 months with your caregiver. Many go away on their own, especially functional cysts.   May be aspirated (drained) with a long needle with ultrasound, or by laparoscopy (inserting a tube into  the pelvis through a small incision).   The whole cyst can be removed by laparoscopy.   Sometimes the cyst may need to be removed through an incision in the lower abdomen.   Hormone treatment is sometimes used to help dissolve certain cysts.   Birth control pills are sometimes used to help dissolve certain cysts.  HOME CARE INSTRUCTIONS   Follow your caregiver's advice regarding:   Medicine.   Follow up visits to evaluate and treat the cyst.   You may need to come back or make an appointment with another caregiver, to find the exact cause of your cyst, if your caregiver is not a gynecologist.   Get your yearly and recommended pelvic examinations and Pap tests.   Let your caregiver know if you have had an ovarian cyst in the past.  SEEK MEDICAL CARE IF:    Your periods are late, irregular, they stop, or are painful.   Your stomach (abdomen) or pelvic pain does not go away.   Your stomach becomes larger or swollen.   You have pressure on your bladder or trouble emptying your bladder completely.   You have painful sexual intercourse.   You have feelings of fullness, pressure, or discomfort in your stomach.   You lose weight for no apparent reason.   You feel generally ill.   You become constipated.   You lose your appetite.   You develop acne.   You have an increase in body and facial hair.   You are gaining weight, without changing your exercise and eating habits.   You think you are pregnant.  SEEK IMMEDIATE MEDICAL CARE IF:    You have increasing abdominal pain.   You feel sick to your stomach (nausea) and/or vomit.   You develop a fever that comes on suddenly.   You develop abdominal pain during a bowel movement.   Your menstrual periods become heavier than usual.  Document Released: 03/03/2005 Document Revised: 05/26/2011 Document Reviewed: 01/04/2009  [employer] Patient Information 2014 [employer].

## 2012-09-01 NOTE — Progress Notes (Signed)
Patient is a 42 year old was seen in the office on 08/04/2012 as a new patient to our practice. See previous note.patient was suffering from menorrhagia. During that exam visit she was noted to have a frothy-like discharge and a wet prep demonstrated trichomoniasis and she was started on tendinitis 2000 mg which she took at one time orally. A full GC and chlamydia culture as well as HIV, RPR, hepatitis B were all obtained and results were negative. She presents today for followup sonohysterogram as part of her evaluation for possible endometrial ablation or placement of Mirena IUD as well as a test of cure. Patient states that her partner was treated.  Wet prep was negative today.  Ultrasound: Uterus measuring 9.0 x 7.8 x 6.3 cm with an endometrium of 15.2 mm patient's last menstrual period 08/16/2012. Patient with prior tubal sterilization procedure. Patient was found to have an intramural fibroid measuring 21 x 0.2 x 1 mm. Try layered endometrium avascular. Right ovary small difficult to identify but surrounding the ovary there was found to be some fluid in a serpentine tubular shaped cystic area. The ovary measured 11 x 17 mm and under color flow. Left ovary thick wall cyst measuring 25 x 22 mm with positive color flow in the periphery consistent with corpus luteum cyst. Since her wet prep was negative the sonohysterogram was performed and normal saline solution and there was no intracavitary defect noted.  Assessment/ plan: Patient had previously been offered Mirena IUD or endometrial ablation or 2 prescribe Lysteda and she would like to proceed with the lLysteda for her menorrhagia. She will be prescribed 650 mg which she will take 2 tablets 3 times a day not to exceed 5 days during her menses. If this does not work she will consider the options. Since patient was recently treated for trichomoniasis and although her chlamydia culture was negative the fluid and possible hydrosalpinx noted on the right  adnexa could have been from infection. She will be placed on Vibramycin 100 mg twice a day for 7 days. She'll receive today Depo-Provera 150 mg IM and she will follow up with an ultrasound in 3 months.

## 2012-09-13 ENCOUNTER — Telehealth: Payer: Self-pay | Admitting: *Deleted

## 2012-09-13 MED ORDER — AZITHROMYCIN 1 G PO PACK: 1.0000 | PACK | Freq: Once | ORAL | Status: DC

## 2012-09-13 NOTE — Telephone Encounter (Signed)
Please call in   Zithromax 1 g by mouth only for one day

## 2012-09-13 NOTE — Telephone Encounter (Signed)
LEFT ON PT VOICEMAIL RX SENT TO PHARMACY.

## 2012-09-13 NOTE — Telephone Encounter (Signed)
PT WAS GIVEN VIBRAMYCIN 100 MG 1 PO TWICE DAILY X 7 DAY ON 09/01/12, PT HAS TIRED MEDICATION 3 TIMES AND MAKES HER SICK. WHEN TAKING THE PILL SHE HAS VOMITING AFTER TAKING PILLS. SHE ASKED IF SOMETHING ELSE COULD BE GIVEN? PLEASE ADVISE

## 2012-10-11 ENCOUNTER — Ambulatory Visit
Admission: RE | Admit: 2012-10-11 | Discharge: 2012-10-11 | Disposition: A | Discharge status: Home / Self Care | Payer: 59 | Source: Ambulatory Visit | Attending: Family Medicine | Admitting: Family Medicine

## 2012-10-11 DIAGNOSIS — R928 Other abnormal and inconclusive findings on diagnostic imaging of breast: Secondary | ICD-10-CM

## 2012-10-29 ENCOUNTER — Encounter: Payer: Managed Care, Other (non HMO) | Admitting: Family Medicine

## 2012-10-29 DIAGNOSIS — Z0289 Encounter for other administrative examinations: Secondary | ICD-10-CM

## 2012-10-29 NOTE — Progress Notes (Signed)
Error   This encounter was created in error - please disregard. 

## 2012-12-01 ENCOUNTER — Ambulatory Visit: Payer: Managed Care, Other (non HMO) | Admitting: Gynecology

## 2012-12-01 ENCOUNTER — Other Ambulatory Visit: Payer: Managed Care, Other (non HMO)

## 2012-12-06 ENCOUNTER — Telehealth: Payer: Self-pay | Admitting: Family Medicine

## 2012-12-06 NOTE — Telephone Encounter (Signed)
Pt is experiencing pain in her left breast. She states that this pain has been coming and going for a couple of months, but that she would like a mammogram. She called the breast center, and they stated that they would need a referral for this to be completed. Please assist.

## 2012-12-06 NOTE — Telephone Encounter (Signed)
She should see her gynecologist. Looks like they had referred her for mammo? But if having pain should see them for evaluation.

## 2012-12-06 NOTE — Telephone Encounter (Signed)
Left a message for return call.  

## 2012-12-07 NOTE — Telephone Encounter (Signed)
Attempted to call pt " mailbox full at this time"

## 2012-12-08 ENCOUNTER — Telehealth: Payer: Self-pay | Admitting: *Deleted

## 2012-12-08 NOTE — Telephone Encounter (Signed)
Pt called left message c/o breast pain, I left message on pt voicemail that OV would be needed with JF.

## 2012-12-08 NOTE — Telephone Encounter (Signed)
Spoke to the pt.  Instructed her to call gynecology for breast pain and mammo referral.

## 2012-12-21 ENCOUNTER — Ambulatory Visit (INDEPENDENT_AMBULATORY_CARE_PROVIDER_SITE_OTHER): Payer: 59 | Admitting: Gynecology

## 2012-12-21 ENCOUNTER — Encounter: Payer: Self-pay | Admitting: Gynecology

## 2012-12-21 VITALS — BP 120/80

## 2012-12-21 DIAGNOSIS — N644 Mastodynia: Secondary | ICD-10-CM

## 2012-12-21 MED ORDER — DANAZOL 50 MG PO CAPS: ORAL_CAPSULE | ORAL | Status: DC

## 2012-12-21 NOTE — Patient Instructions (Addendum)
Danazol capsules What is this medicine? DANAZOL (DA na zole) is used in women to treat endometriosis and the symptoms of fibrocystic breast disease. This medicine may also be used in men and women to prevent serious allergic reactions known as angioedema. This medicine may be used for other purposes; ask your health care provider or pharmacist if you have questions. What should I tell my health care provider before I take this medicine? They need to know if you have any of these conditions: -breast cancer -heart disease -kidney disease -liver disease -porphyria -unusual vaginal bleeding -an unusual or allergic reaction to danazol, other medicines, foods, dyes, or preservatives -pregnant or trying to get pregnant -breast-feeding How should I use this medicine? Take this medicine by mouth with a glass of water. Follow the directions on the prescription label. Take this medicine with food to decrease stomach upset. Take your doses at regular intervals. Do not take your medicine more often than directed. Talk to your pediatrician regarding the use of this medicine in children. Special care may be needed. Overdosage: If you think you have taken too much of this medicine contact a poison control center or emergency room at once. NOTE: This medicine is only for you. Do not share this medicine with others. What if I miss a dose? If you miss a dose, take it as soon as you can. If it is almost time for your next dose, take only that dose. Do not take double or extra doses. What may interact with this medicine? Do not take this medicine with any of the following medications: -cisapride -pimozide -ranolazine This medicine may also interact with the following medications: -carbamazepine -medicines that treat or prevent blood clots like warfarin This list may not describe all possible interactions. Give your health care provider a list of all the medicines, herbs, non-prescription drugs, or dietary  supplements you use. Also tell them if you smoke, drink alcohol, or use illegal drugs. Some items may interact with your medicine. What should I watch for while using this medicine? Check with your doctor or health care professional if you are a female patient and notice any changes in your voice, decrease in breast size, or if hair starts growing on your face. This medicine should not be used in pregnancy. You should use a non-hormonal form of birth control while on this medicine. If you become pregnant or think you may be pregnant while you are taking this medicine, you should stop taking this medicine and contact your doctor or health care professional. This medicine may cause risk to a female fetus. This medicine can affect your menstrual cycle and you may stop having menstrual periods. These will return to normal within 2 to 3 months after you stop taking this medicine. What side effects may I notice from receiving this medicine? Side effects that you should report to your doctor or health care professional as soon as possible: -allergic reactions like skin rash, itching or hives, swelling of the face, lips, or tongue -changes in vision -dark urine -decrease in breast size -hair loss or unusual hair growth -headache -irregular vaginal bleeding, spotting -nausea, vomiting, stomach pain -redness, blistering, peeling or loosening of the skin, including inside the mouth -unusual bleeding or bruising -unusual swelling of feet or ankles -unusually weak or tired -voice changes -weight gain -yellowing of the skin or eyes Side effects that usually do not require medical attention (report to your doctor or health care professional if they continue or are bothersome): -acne, oily  skin -hot flashes, sweating -mood changes -vaginal dryness or irritation This list may not describe all possible side effects. Call your doctor for medical advice about side effects. You may report side effects to FDA  at 1-800-FDA-1088. Where should I keep my medicine? Keep out of the reach of children. Store at room temperature between 15 and 30 degrees C (59 and 86 degrees F). Throw away any unused medicine after the expiration date. NOTE: This sheet is a summary. It may not cover all possible information. If you have questions about this medicine, talk to your doctor, pharmacist, or health care provider.  2012, [employer]vier/Gold Standard. (06/24/2007 4:38:33 PM)

## 2012-12-21 NOTE — Progress Notes (Signed)
42 year old who presented to the office stating that since she had her mammogram July 28 of this year she is complaining of on and off left breast tenderness. Her mammogram was normal. Her recent mammogram was compared with a previous study done several years ago in [place] with the recent report as follows:  Prior mammogram from [facility] dated  12/30/2007 is now available for comparison. There has been no  change in the nodular density in the upper portion of the right  breast since 2009. This finding is therefore a benign finding. No  new worrisome finding to suggest malignancy is identified in either  breast. Therefore, no additional views are indicated today.  IMPRESSION:  No mammographic evidence of malignancy.  Patient denies any recent trauma, or nipple discharge. She denies palpating masses.  Exam: Breast exam was undertaken in the sitting or supine position. Patient with large pendulous breasts. No skin discoloration no nipple inversion no palpable masses or tenderness and no supraclavicular or axillary lymphadenopathy.  Patient states that she does not drink coffee or tea or any caffeine-containing products are regular basis.  Assessment/plan: Patient with persistent nonspecific area of the left breast with tenderness and throbbing sensation on the knot. Recent mammogram 2 months ago normal. Exam today otherwise unremarkable. She will be sent back to the radiologist for an ultrasound of the left breast to see if  the area where she is tender is different from the time of her mammogram. For her mastodynia she was recommended to change her bra support. She will be prescribed danazol 25 mg 1 by mouth daily for 3 months. She will also be instructed to begin taking vitamin E6 100 units daily.

## 2012-12-22 ENCOUNTER — Telehealth: Payer: Self-pay | Admitting: *Deleted

## 2012-12-22 ENCOUNTER — Other Ambulatory Visit: Payer: Self-pay | Admitting: Gynecology

## 2012-12-22 DIAGNOSIS — N644 Mastodynia: Secondary | ICD-10-CM

## 2012-12-22 NOTE — Telephone Encounter (Signed)
Per JF the below will be schedule: order placed breast center will contact patient.  Patient with persistent nonspecific area of the left breast with tenderness and throbbing sensation on the knot. Recent mammogram 2 months ago normal. Exam today otherwise unremarkable. She will be sent back to the radiologist for an ultrasound of the left breast to see if the area where she is tender is different from the time of her mammogram.

## 2013-01-07 NOTE — Telephone Encounter (Signed)
Appt 01/12/13 

## 2013-01-12 ENCOUNTER — Other Ambulatory Visit: Payer: 59

## 2013-06-25 ENCOUNTER — Other Ambulatory Visit: Payer: Self-pay | Admitting: Family Medicine

## 2013-08-01 ENCOUNTER — Other Ambulatory Visit: Payer: Self-pay | Admitting: Family Medicine

## 2013-08-01 NOTE — Telephone Encounter (Signed)
Needs appointment in next month - can refill to appointment.

## 2013-08-01 NOTE — Telephone Encounter (Signed)
Rx sent to pts pharmacy for 30 day supply

## 2013-08-01 NOTE — Telephone Encounter (Signed)
Pt is scheduled to come in 08/02/13 at 10 am

## 2013-08-02 ENCOUNTER — Ambulatory Visit (INDEPENDENT_AMBULATORY_CARE_PROVIDER_SITE_OTHER): Payer: 59 | Admitting: Family Medicine

## 2013-08-02 ENCOUNTER — Encounter: Payer: Self-pay | Admitting: Family Medicine

## 2013-08-02 VITALS — BP 100/72 | HR 80 | Temp 99.3°F | Ht 65.75 in | Wt 248.0 lb

## 2013-08-02 DIAGNOSIS — F32A Depression, unspecified: Secondary | ICD-10-CM

## 2013-08-02 DIAGNOSIS — E785 Hyperlipidemia, unspecified: Secondary | ICD-10-CM

## 2013-08-02 DIAGNOSIS — F3289 Other specified depressive episodes: Secondary | ICD-10-CM

## 2013-08-02 DIAGNOSIS — F329 Major depressive disorder, single episode, unspecified: Secondary | ICD-10-CM

## 2013-08-02 MED ORDER — FLUOXETINE HCL 20 MG PO TABS: 40.0000 mg | ORAL_TABLET | Freq: Every day | ORAL | Status: DC

## 2013-08-02 NOTE — Progress Notes (Signed)
No chief complaint on file.   HPI:  Follow up:  Note: has been over 1 year since her last visit.  Depression: -continued prozac and advised to follow up in 3 months last visit -was getting counseling -reports has been off the prozac for sometime but recently had to sit through son's murder trail  -symptoms have been depressed mood, tearful, emotional -denies: thought of self harm  HLD: -lifestyle recs advised  Anemia, heavy menstrual bleeding, breast tenderness: -seeing gyn  ROS: See pertinent positives and negatives per HPI.  Past Medical History  Diagnosis Date  . Depression   . Anemia   . Heavy menstrual bleeding   . Hyperlipidemia     Past Surgical History  Procedure Laterality Date  . Ovarian cyst removal      benign cyst  . Breast biopsy  2009    Family History  Problem Relation Age of Onset  . Cancer Mother     lung cancer, smoker  . Diabetes Mother   . Diabetes Maternal Aunt   . Cancer Maternal Aunt     stomach cancer  . Diabetes Maternal Grandmother     History   Social History  . Marital Status: Single    Spouse Name: N/A    Number of Children: N/A  . Years of Education: N/A   Social History Main Topics  . Smoking status: Never Smoker   . Smokeless tobacco: None  . Alcohol Use: No  . Drug Use: No  . Sexual Activity: Yes     Comment: TUBAL LIGATION   Other Topics Concern  . 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  GENERAL:  vitals reviewed and listed above, alert, oriented, appears well hydrated and in no acute distress  HEENT: atraumatic, conjunttiva clear, no obvious abnormalities on inspection of external nose and ears  NECK: no obvious masses on inspection  LUNGS: clear to auscultation bilaterally, no wheezes, rales or rhonchi, good air movement  CV: HRRR, no peripheral edema  MS: moves all extremities without noticeable abnormality  PSYCH: pleasant and cooperative, no obvious depression or anxiety  ASSESSMENT AND PLAN:  Discussed the following assessment and plan:  Depression - Plan: FLUoxetine (PROZAC) 20 MG tablet  Hyperlipemia  -Patient advised to return or notify a doctor immediately if symptoms worsen or persist or new concerns arise.  Patient Instructions  -restart the prozac - start 20 mg daily, then can increase to 40mg  daily in 2 weeks  -regular exercise at least 150 minutes per week  -consider counseling  -schedule morning follow up and physical in 6 weeks - come fasting but drink plenty of water that day       Amanda Bridges

## 2013-08-02 NOTE — Patient Instructions (Addendum)
-  restart the prozac - start 20 mg daily, then can increase to 40mg  daily in 2 weeks  -regular exercise at least 150 minutes per week  -consider counseling - call to set up with numbers provided  -schedule morning follow up and physical in 6 weeks - come fasting but drink plenty of water that day  -follow up sooner if any worsening or concerns

## 2013-08-02 NOTE — Progress Notes (Signed)
Pre visit review using our clinic review tool, if applicable. No additional management support is needed unless otherwise documented below in the visit note. 

## 2013-09-14 ENCOUNTER — Encounter: Payer: 59 | Admitting: Family Medicine

## 2013-10-01 ENCOUNTER — Other Ambulatory Visit: Payer: Self-pay | Admitting: Family Medicine

## 2013-10-20 ENCOUNTER — Encounter: Payer: 59 | Admitting: Family Medicine

## 2013-11-30 ENCOUNTER — Encounter: Payer: Self-pay | Admitting: Family Medicine

## 2013-11-30 ENCOUNTER — Ambulatory Visit (INDEPENDENT_AMBULATORY_CARE_PROVIDER_SITE_OTHER): Payer: 59 | Admitting: Family Medicine

## 2013-11-30 VITALS — BP 110/80 | HR 89 | Temp 97.7°F | Ht 67.0 in | Wt 241.0 lb

## 2013-11-30 DIAGNOSIS — F32A Depression, unspecified: Secondary | ICD-10-CM

## 2013-11-30 DIAGNOSIS — D259 Leiomyoma of uterus, unspecified: Secondary | ICD-10-CM

## 2013-11-30 DIAGNOSIS — E785 Hyperlipidemia, unspecified: Secondary | ICD-10-CM

## 2013-11-30 DIAGNOSIS — E669 Obesity, unspecified: Secondary | ICD-10-CM

## 2013-11-30 DIAGNOSIS — Z0000 Encounter for general adult medical examination without abnormal findings: Secondary | ICD-10-CM

## 2013-11-30 DIAGNOSIS — F329 Major depressive disorder, single episode, unspecified: Secondary | ICD-10-CM

## 2013-11-30 DIAGNOSIS — F3289 Other specified depressive episodes: Secondary | ICD-10-CM

## 2013-11-30 DIAGNOSIS — D509 Iron deficiency anemia, unspecified: Secondary | ICD-10-CM

## 2013-11-30 LAB — CBC WITH DIFFERENTIAL/PLATELET
BASOS ABS: 0 10*3/uL (ref 0.0–0.1)
BASOS PCT: 0.5 % (ref 0.0–3.0)
EOS ABS: 0 10*3/uL (ref 0.0–0.7)
Eosinophils Relative: 1 % (ref 0.0–5.0)
HCT: 38.8 % (ref 36.0–46.0)
Hemoglobin: 12.4 g/dL (ref 12.0–15.0)
LYMPHS PCT: 21.6 % (ref 12.0–46.0)
Lymphs Abs: 0.9 10*3/uL (ref 0.7–4.0)
MCHC: 31.9 g/dL (ref 30.0–36.0)
MCV: 84.2 fl (ref 78.0–100.0)
Monocytes Absolute: 0.3 10*3/uL (ref 0.1–1.0)
Monocytes Relative: 6.5 % (ref 3.0–12.0)
NEUTROS PCT: 70.4 % (ref 43.0–77.0)
Neutro Abs: 3 10*3/uL (ref 1.4–7.7)
PLATELETS: 310 10*3/uL (ref 150.0–400.0)
RBC: 4.61 Mil/uL (ref 3.87–5.11)
RDW: 14.5 % (ref 11.5–15.5)
WBC: 4.3 10*3/uL (ref 4.0–10.5)

## 2013-11-30 LAB — LIPID PANEL
CHOLESTEROL: 176 mg/dL (ref 0–200)
HDL: 31.3 mg/dL — AB (ref >39.00)
LDL Cholesterol: 112 mg/dL — ABNORMAL HIGH (ref 0–99)
NonHDL: 144.7
TRIGLYCERIDES: 165 mg/dL — AB (ref 0.0–149.0)
Total CHOL/HDL Ratio: 6
VLDL: 33 mg/dL (ref 0.0–40.0)

## 2013-11-30 LAB — HEMOGLOBIN A1C: Hgb A1c MFr Bld: 7.5 % — ABNORMAL HIGH (ref 4.6–6.5)

## 2013-11-30 MED ORDER — FLUOXETINE HCL 20 MG PO CAPS: 20.0000 mg | ORAL_CAPSULE | Freq: Every day | ORAL | Status: DC

## 2013-11-30 NOTE — Patient Instructions (Signed)
-  do not stop the antidepressant suddenly - you would need to taper this medication  -schedule physical with gynecologist and mammogram  -vitamin D3 1000 IU daily; calcium 1200mg  daily from diet and supplement only if inadequate dietary intake  -We have ordered labs or studies at this visit. It can take up to 1-2 weeks for results and processing. We will contact you with instructions IF your results are abnormal. Normal results will be released to your [facility]. If you have not heard from [place] or can not find your results in [facility] in 2 weeks please contact our office.  -PLEASE SIGN UP FOR MYCHART TODAY   We recommend the following healthy lifestyle measures: - eat a healthy diet consisting of lots of vegetables, fruits, beans, nuts, seeds, healthy meats such as white chicken and fish and whole grains.  - avoid fried foods, fast food, processed foods, sodas, red meet and other fattening foods.  - get a least 150 minutes of aerobic exercise per week.   Follow up in: 6 months

## 2013-11-30 NOTE — Progress Notes (Signed)
No chief complaint on file.   HPI:  Here for CPE:  -Concerns and/or follow up today:   1) Depression: -restarted ssri in 07/2013, pt did not follow up as advised -reports: feels like prozac has helped a lot Depression Symptoms: Sleep disorder: no Interest deficit/anhedonia: no, improved Guilt (worthlessness, hopelessness, regret): no  Energy deficit: mild Concentration deficit: no Appetite disorder: no Psychomotor retardation or agitation:  no Suicidality: no  2)HLD: -mild -Diet: variety of foods, balance and well rounded, larger portion sizes -Exercise: no regular exercise  3)Anemia, Heavy menstrual Bleeding, mastodynia: -seeing gyn for this, on iron supplement -this has improved, bleeding has improved -denies: palpitations, fatigue, heavy or prolonged bleeding, irr bleeding  -Taking folic acid, vitamin D or calcium: no  -Diabetes and Dyslipidemia Screening: labs today  -Hx of HTN: no  -Vaccines: reports she will get the flu shot at work  -pap history: sees gyn for paps and breast exams  -FDLMP: regular, monthly, last period 5 days ago  -wants STI testing: declined  -FH breast, colon or ovarian ca: see FH Last mammogram: 08/2012 Last colon cancer screening: n/a  Breast Ca Risk Assessment: -sees gyn for breast health  -Alcohol, Tobacco, drug use: see social history  Review of Systems - no fevers, unintentional weight loss, vision loss, hearing loss, chest pain, sob, hemoptysis, melena, hematochezia, hematuria, genital discharge, changing or concerning skin lesions, bleeding, bruising, loc, thoughts of self harm or SI  Past Medical History  Diagnosis Date  . Depression   . Anemia   . Heavy menstrual bleeding   . Hyperlipidemia     Past Surgical History  Procedure Laterality Date  . Ovarian cyst removal      benign cyst  . Breast biopsy  2009    Family History  Problem Relation Age of Onset  . Cancer Mother     lung cancer, smoker  . Diabetes  Mother   . Diabetes Maternal Aunt   . Cancer Maternal Aunt     stomach cancer  . Diabetes Maternal Grandmother     History   Social History  . Marital Status: Single    Spouse Name: N/A    Number of Children: N/A  . Years of Education: N/A   Social History Main Topics  . Smoking status: Never Smoker   . Smokeless tobacco: None  . Alcohol Use: No  . Drug Use: No  . Sexual Activity: Yes     Comment: TUBAL LIGATION   Other Topics Concern  . 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 moist mucus membranes.  NECK: supple, no masses or lymphadenopathy  LUNGS: clear to auscultation bilaterally, no rales, rhonchi or wheeze  CV: HRRR, no peripheral  edema or cyanosis, normal pedal pulses  BREAST: declined  ABDOMEN: bowel sounds normal, soft, non tender to palpation, no masses, no rebound or guarding  GU: declined  RECTAL: refused  SKIN: no rash or abnormal lesions  MS: normal gait, moves all extremities normally  NEURO: CN II-XII grossly intact, normal muscle strength and sensation to light touch on extremities  PSYCH: normal affect, pleasant and cooperative  ASSESSMENT AND PLAN:  Discussed the following assessment and plan:  1. Visit for preventive health examination -Discussed and advised all [place] preventive  services health task force level A and B recommendations for age, sex and risks. -she gets pap, mammo with gyn - advised to schedule -she declined flu vaccine, wants to get at work -Advised at least 150 minutes of exercise per week and a healthy diet low in saturated fats and sweets and consisting of fresh fruits and vegetables, lean meats such as fish and white chicken and whole grains. -labs, studies and vaccines per orders this encounter  2. Depression - FLUoxetine (PROZAC) 20 MG capsule; Take 1 capsule (20 mg total) by mouth daily.  Dispense: 30 capsule; Refill: 6 -doing well on ssri -consider wean off in 6 months  3. Hyperlipemia - FASTING Lipid Panel - Hemoglobin A1c -stressed importance of regular CV exercise and healthy diet  4. Obesity, unspecified - Hemoglobin A1c -advised regular CV exercise and healthy diet  5. Anemia, iron deficiency - CBC with Differential -continue iron supplement, follow up with gyn  6. Uterine leiomyoma, unspecified location - CBC with Differential      Orders Placed This Encounter  Procedures  . CBC with Differential  . Lipid Panel  . Hemoglobin A1c    Patient advised to return to clinic immediately if symptoms worsen or persist or new concerns.  Patient Instructions  -do not stop the antidepressant suddenly - you would need to taper this medication  -schedule physical with gynecologist and mammogram  -vitamin D3 1000 IU daily; calcium 1200mg  daily from diet and supplement only if inadequate dietary intake  -We have ordered labs or studies at this visit. It can take up to 1-2 weeks for results and processing. We will contact you with instructions IF your results are abnormal. Normal results will be released to your [facility]. If you have not heard from [place] or can not find your results in [facility] in 2 weeks please contact our office.  -PLEASE SIGN UP FOR MYCHART TODAY   We recommend the following healthy lifestyle measures: - eat a  healthy diet consisting of lots of vegetables, fruits, beans, nuts, seeds, healthy meats such as white chicken and fish and whole grains.  - avoid fried foods, fast food, processed foods, sodas, red meet and other fattening foods.  - get a least 150 minutes of aerobic exercise per week.   Follow up in: 6 months     Return in about 6 months (around 05/31/2014) for follow up.  Colin Benton R.

## 2013-11-30 NOTE — Progress Notes (Signed)
Pre visit review using our clinic review tool, if applicable. No additional management support is needed unless otherwise documented below in the visit note. 

## 2013-12-07 ENCOUNTER — Encounter: Payer: Self-pay | Admitting: *Deleted

## 2013-12-09 ENCOUNTER — Telehealth: Payer: Self-pay | Admitting: *Deleted

## 2013-12-09 DIAGNOSIS — E119 Type 2 diabetes mellitus without complications: Secondary | ICD-10-CM

## 2013-12-09 NOTE — Telephone Encounter (Signed)
Patient called in for her lab test results. I advised the pt of her results and scheduled an appt for a follow up with Dr Maudie Mercury on 10/22.  She is aware to expect a call regarding the appt with the diabetes educator.

## 2013-12-22 ENCOUNTER — Encounter: Payer: 59 | Attending: Family Medicine

## 2013-12-22 VITALS — Ht 67.0 in | Wt 241.0 lb

## 2013-12-22 DIAGNOSIS — E119 Type 2 diabetes mellitus without complications: Secondary | ICD-10-CM | POA: Diagnosis present

## 2013-12-22 DIAGNOSIS — Z713 Dietary counseling and surveillance: Secondary | ICD-10-CM | POA: Diagnosis not present

## 2013-12-23 NOTE — Progress Notes (Signed)
Patient was seen on 12/22/2013 for the first of a series of three diabetes self-management courses at [facility]  Patient Education Plan per assessed needs and concerns is to attend four course education program for Diabetes Self Management Education.  The following learning objectives were met by the patient during this class:  Describe diabetes  State some common risk factors for diabetes  Defines the role of glucose and insulin  Identifies type of diabetes and pathophysiology  Describe the relationship between diabetes and cardiovascular risk  State the members of the Healthcare Team  States the rationale for glucose monitoring  State when to test glucose  State their individual Target Range  State the importance of logging glucose readings  Describe how to interpret glucose readings  Identifies A1C target  Explain the correlation between A1c and eAG values  State symptoms and treatment of high blood glucose  State symptoms and treatment of low blood glucose  Explain proper technique for glucose testing  Identifies proper sharps disposal  Handouts given during class include:  Living Well with Diabetes book  Carb Counting and Meal Planning book  Meal Plan Card  Carbohydrate guide  Meal planning worksheet  Low Sodium Flavoring Tips  The diabetes portion plate  I4N to eAG Conversion Chart  Diabetes Medications  Diabetes Recommended Care Schedule  Support Group  Diabetes Success Plan  Core Class Satisfaction Survey  Follow-Up Plan:  Attend core 2

## 2014-01-05 ENCOUNTER — Encounter: Payer: 59 | Admitting: Family Medicine

## 2014-01-05 DIAGNOSIS — E119 Type 2 diabetes mellitus without complications: Secondary | ICD-10-CM | POA: Diagnosis not present

## 2014-01-05 NOTE — Progress Notes (Signed)
Error   This encounter was created in error - please disregard. 

## 2014-01-09 ENCOUNTER — Encounter: Payer: Self-pay | Admitting: Family Medicine

## 2014-01-09 ENCOUNTER — Ambulatory Visit (INDEPENDENT_AMBULATORY_CARE_PROVIDER_SITE_OTHER): Payer: 59 | Admitting: Family Medicine

## 2014-01-09 VITALS — BP 120/84 | HR 79 | Temp 98.4°F | Ht 67.0 in | Wt 238.9 lb

## 2014-01-09 DIAGNOSIS — E785 Hyperlipidemia, unspecified: Secondary | ICD-10-CM

## 2014-01-09 DIAGNOSIS — E669 Obesity, unspecified: Secondary | ICD-10-CM

## 2014-01-09 DIAGNOSIS — E119 Type 2 diabetes mellitus without complications: Secondary | ICD-10-CM

## 2014-01-09 LAB — MICROALBUMIN / CREATININE URINE RATIO
CREATININE, U: 219 mg/dL
MICROALB/CREAT RATIO: 0.4 mg/g (ref 0.0–30.0)
Microalb, Ur: 0.9 mg/dL (ref 0.0–1.9)

## 2014-01-09 LAB — BASIC METABOLIC PANEL
BUN: 13 mg/dL (ref 6–23)
CHLORIDE: 104 meq/L (ref 96–112)
CO2: 25 mEq/L (ref 19–32)
Calcium: 9.7 mg/dL (ref 8.4–10.5)
Creatinine, Ser: 0.9 mg/dL (ref 0.4–1.2)
GFR: 86.59 mL/min (ref >60.00)
Glucose, Bld: 86 mg/dL (ref 70–99)
POTASSIUM: 3.9 meq/L (ref 3.5–5.1)
SODIUM: 135 meq/L (ref 135–145)

## 2014-01-09 NOTE — Patient Instructions (Addendum)
BEFORE YOU LEAVE: -go to lab -schedule 3 month follow up   FOR YOUR DIABETES:  [] START Metformin: start 500mg  once daily with breakfast for 1 week, then 500mg  twice daily  []  Eat a healthy low carb diet (avoid sweets, sweet drinks, breads, potatoes, rice, etc.) and ensure 3 small meals daily.  []  Get AT LEAST 150 minutes of cardiovascular exercise per week - 30 minutes per day is best of sustained sweaty exercise.  []  Take all of your medications every day as directed by your doctor. Call your doctor immediately if you have any questions about your medications or are running low.  []  Check your blood sugar often and when you feel unwell and keep a log to bring to all health appointments. FASTING: before you eat anything in the morning POSTPRANDIAL: 1-2 hours after a meal  []  If any low blood sugars < 70, eat a snack and call your doctor immediately.  []  See an eye doctor every year and fax your diabetic eye exam to our office.  Fax: [phone]  []  Take good care of your feet and keep them soft and callus free. Check your feet daily and wear comfortable shoes. See your doctor immediately if you have any cuts, calluses or wounds on your feet.  You may find this book helpful for your diabetes. It is available on [place] and is fairly inexpensive.  Dr. Janene Harvey Program for Reversing Diabetes: The Scientifically Proven System for Reversing Diabetes without Drugs  http://www.nealb[facility].org/index.cfm

## 2014-01-09 NOTE — Progress Notes (Signed)
No chief complaint on file.   HPI:  Follow up:  Here to discuss labs.  1)Diabetes: -on recent labs -lifestyle recs and referral to nutrition already done - she is working out and working on diet -denies: polyuria, polydipsia -offered pneumonia vaccine, she declined  2)HLD: -lipids not at goal for diabetic pt -working on diet and exercise   ROS: See pertinent positives and negatives per HPI.  Past Medical History  Diagnosis Date  . Depression   . Anemia   . Heavy menstrual bleeding   . Hyperlipidemia   . Diabetes mellitus without complication     Past Surgical History  Procedure Laterality Date  . Ovarian cyst removal      benign cyst  . Breast biopsy  2009    Family History  Problem Relation Age of Onset  . Cancer Mother     lung cancer, smoker  . Diabetes Mother   . Diabetes Maternal Aunt   . Cancer Maternal Aunt     stomach cancer  . Diabetes Maternal Grandmother     History   Social History  . Marital Status: Single    Spouse Name: N/A    Number of Children: N/A  . Years of Education: N/A   Social History Main Topics  . Smoking status: Never Smoker   . Smokeless tobacco: None  . Alcohol Use: No  . Drug Use: No  . Sexual Activity: Yes     Comment: TUBAL LIGATION   Other Topics Concern  . 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  GENERAL: vitals reviewed and listed above, alert, oriented, appears well hydrated and in no acute  distress  HEENT: atraumatic, conjunttiva clear, no obvious abnormalities on inspection of external nose and ears  NECK: no obvious masses on inspection  LUNGS: clear to auscultation bilaterally, no wheezes, rales or rhonchi, good air movement  CV: HRRR, no peripheral edema  MS: moves all extremities without noticeable abnormality  PSYCH: pleasant and cooperative, no obvious depression or anxiety  ASSESSMENT AND PLAN:  Discussed the following assessment and plan:  Type 2 diabetes mellitus without complication - Plan: Microalbumin/Creatinine Ratio, Urine, Basic metabolic panel  Hyperlipemia  Obesity  -discussed options -she is working aggressively on diet and exercise and wants to start metformin after discussion of risks/benefits -she declined home glucose monitoring -follow up in 3 months -Patient advised to return or notify a doctor immediately if symptoms worsen or persist or new concerns arise.  Patient Instructions  BEFORE YOU LEAVE: -go to lab -schedule 3 month follow up   FOR YOUR DIABETES:  [] START Metformin: start 500mg  once daily with breakfast for 1 week, then 500mg  twice daily  []  Eat a healthy low carb diet (avoid sweets, sweet drinks, breads, potatoes, rice, etc.) and ensure 3 small meals daily.  []  Get AT LEAST 150  minutes of cardiovascular exercise per week - 30 minutes per day is best of sustained sweaty exercise.  []  Take all of your medications every day as directed by your doctor. Call your doctor immediately if you have any questions about your medications or are running low.  []  Check your blood sugar often and when you feel unwell and keep a log to bring to all health appointments. FASTING: before you eat anything in the morning POSTPRANDIAL: 1-2 hours after a meal  []  If any low blood sugars < 70, eat a snack and call your doctor immediately.  []  See an eye doctor every year and fax your diabetic eye exam to our office.  Fax:  [phone]  []  Take good care of your feet and keep them soft and callus free. Check your feet daily and wear comfortable shoes. See your doctor immediately if you have any cuts, calluses or wounds on your feet.  You may find this book helpful for your diabetes. It is available on [place] and is fairly inexpensive.  Dr. Janene Harvey Program for Reversing Diabetes: The Scientifically Proven System for Reversing Diabetes without Drugs  http://www.nealbarnard.org/index.cfm        Colin Benton R.

## 2014-01-09 NOTE — Progress Notes (Signed)
Pre visit review using our clinic review tool, if applicable. No additional management support is needed unless otherwise documented below in the visit note. 

## 2014-01-12 ENCOUNTER — Other Ambulatory Visit: Payer: Self-pay | Admitting: *Deleted

## 2014-01-12 DIAGNOSIS — E119 Type 2 diabetes mellitus without complications: Secondary | ICD-10-CM

## 2014-01-13 ENCOUNTER — Other Ambulatory Visit (INDEPENDENT_AMBULATORY_CARE_PROVIDER_SITE_OTHER): Payer: 59

## 2014-01-13 DIAGNOSIS — E119 Type 2 diabetes mellitus without complications: Secondary | ICD-10-CM

## 2014-01-13 LAB — HEMOGLOBIN A1C: HEMOGLOBIN A1C: 6.8 % — AB (ref 4.6–6.5)

## 2014-01-16 ENCOUNTER — Encounter: Payer: Self-pay | Admitting: Family Medicine

## 2014-01-25 NOTE — Progress Notes (Signed)
Patient was seen on 01/05/14 for the third of a series of three diabetes self-management courses at the [facility]. The following learning objectives were met by the patient during this class:  . State the amount of activity recommended for healthy living . Describe activities suitable for individual needs . Identify ways to regularly incorporate activity into daily life . Identify barriers to activity and ways to over come these barriers  Identify diabetes medications being personally used and their primary action for lowering glucose and possible side effects . Describe role of stress on blood glucose and develop strategies to address psychosocial issues . Identify diabetes complications and ways to prevent them  Explain how to manage diabetes during illness . Evaluate success in meeting personal goal . Establish 2-3 goals that they will plan to diligently work on until they return for the  41-month follow-up visit  Goals:   I will count my carb choices at most meals and snacks  I will be active 6 times a week  I will eat less unhealthy fats.  To help manage stress I will  dance at least 3 times a week  Your patient has identified these potential barriers to change:  Stress  Your patient has identified their diabetes self-care support plan as  Family Support On-line Resources Plan:  Attend Core 4 in 4 months

## 2014-01-27 ENCOUNTER — Emergency Department (HOSPITAL_COMMUNITY)
Admission: EM | Admit: 2014-01-27 | Discharge: 2014-01-27 | Disposition: A | Discharge status: Home / Self Care | Payer: 59 | Attending: Emergency Medicine | Admitting: Emergency Medicine

## 2014-01-27 ENCOUNTER — Encounter (HOSPITAL_COMMUNITY): Payer: Self-pay | Admitting: Emergency Medicine

## 2014-01-27 ENCOUNTER — Emergency Department (HOSPITAL_COMMUNITY): Payer: 59

## 2014-01-27 DIAGNOSIS — F329 Major depressive disorder, single episode, unspecified: Secondary | ICD-10-CM | POA: Diagnosis not present

## 2014-01-27 DIAGNOSIS — Z79899 Other long term (current) drug therapy: Secondary | ICD-10-CM | POA: Diagnosis not present

## 2014-01-27 DIAGNOSIS — R0602 Shortness of breath: Secondary | ICD-10-CM | POA: Diagnosis not present

## 2014-01-27 DIAGNOSIS — Z8742 Personal history of other diseases of the female genital tract: Secondary | ICD-10-CM | POA: Diagnosis not present

## 2014-01-27 DIAGNOSIS — R05 Cough: Secondary | ICD-10-CM | POA: Insufficient documentation

## 2014-01-27 DIAGNOSIS — E119 Type 2 diabetes mellitus without complications: Secondary | ICD-10-CM | POA: Insufficient documentation

## 2014-01-27 DIAGNOSIS — Z862 Personal history of diseases of the blood and blood-forming organs and certain disorders involving the immune mechanism: Secondary | ICD-10-CM | POA: Diagnosis not present

## 2014-01-27 LAB — COMPREHENSIVE METABOLIC PANEL
ALBUMIN: 3.5 g/dL (ref 3.5–5.2)
ALK PHOS: 70 U/L (ref 39–117)
ALT: 15 U/L (ref 0–35)
AST: 15 U/L (ref 0–37)
Anion gap: 13 (ref 5–15)
BILIRUBIN TOTAL: 0.3 mg/dL (ref 0.3–1.2)
BUN: 13 mg/dL (ref 6–23)
CHLORIDE: 103 meq/L (ref 96–112)
CO2: 23 meq/L (ref 19–32)
CREATININE: 0.7 mg/dL (ref 0.50–1.10)
Calcium: 9.4 mg/dL (ref 8.4–10.5)
GFR calc Af Amer: >90 mL/min (ref >90)
Glucose, Bld: 113 mg/dL — ABNORMAL HIGH (ref 70–99)
POTASSIUM: 3.8 meq/L (ref 3.7–5.3)
Sodium: 139 mEq/L (ref 137–147)
Total Protein: 6.6 g/dL (ref 6.0–8.3)

## 2014-01-27 LAB — CBC WITH DIFFERENTIAL/PLATELET
BASOS ABS: 0 10*3/uL (ref 0.0–0.1)
Basophils Relative: 0 % (ref 0–1)
Eosinophils Absolute: 0.1 10*3/uL (ref 0.0–0.7)
Eosinophils Relative: 2 % (ref 0–5)
HEMATOCRIT: 37.7 % (ref 36.0–46.0)
HEMOGLOBIN: 12 g/dL (ref 12.0–15.0)
LYMPHS PCT: 43 % (ref 12–46)
Lymphs Abs: 2.2 10*3/uL (ref 0.7–4.0)
MCH: 27 pg (ref 26.0–34.0)
MCHC: 31.8 g/dL (ref 30.0–36.0)
MCV: 84.9 fL (ref 78.0–100.0)
MONO ABS: 0.4 10*3/uL (ref 0.1–1.0)
MONOS PCT: 8 % (ref 3–12)
NEUTROS ABS: 2.4 10*3/uL (ref 1.7–7.7)
NEUTROS PCT: 47 % (ref 43–77)
Platelets: 295 10*3/uL (ref 150–400)
RBC: 4.44 MIL/uL (ref 3.87–5.11)
RDW: 13.4 % (ref 11.5–15.5)
WBC: 5.1 10*3/uL (ref 4.0–10.5)

## 2014-01-27 LAB — D-DIMER, QUANTITATIVE: D-Dimer, Quant: <0.27 ug/mL-FEU (ref 0.00–0.48)

## 2014-01-27 LAB — PRO B NATRIURETIC PEPTIDE: Pro B Natriuretic peptide (BNP): 18.7 pg/mL (ref 0–125)

## 2014-01-27 LAB — TROPONIN I

## 2014-01-27 NOTE — Discharge Instructions (Signed)

## 2014-01-27 NOTE — ED Provider Notes (Signed)
CSN: [id number]     Arrival date & time 01/27/14  0107 History   First MD Initiated Contact with Patient 01/27/14 0114     Chief Complaint  Patient presents with  . Shortness of Breath  . Cough     (Consider location/radiation/quality/duration/timing/severity/associated sxs/prior Treatment) HPI Comments: Patient presents to the ER for evaluation of difficulty breathing. Patient reports that she awakened feeling short of breath. This is unusual for her, she denies any history of lung disease. She has not been expressing any chest pain or palpitations. Patient reports that since awakening, shortness of breath has been constant. She has not identified alleviating or exacerbating factors. She reports that 2 days ago she had sinus congestion and a sore throat, but that resolved yesterday. Tonight she has noticed an occasional dry cough.  Patient is a 43 y.o. female presenting with shortness of breath and cough.  Shortness of Breath Associated symptoms: cough   Associated symptoms: no chest pain   Cough Associated symptoms: shortness of breath   Associated symptoms: no chest pain     Past Medical History  Diagnosis Date  . Depression   . Anemia   . Heavy menstrual bleeding   . Hyperlipidemia   . Diabetes mellitus without complication    Past Surgical History  Procedure Laterality Date  . Ovarian cyst removal      benign cyst  . Breast biopsy  2009   Family History  Problem Relation Age of Onset  . Cancer Mother     lung cancer, smoker  . Diabetes Mother   . Diabetes Maternal Aunt   . Cancer Maternal Aunt     stomach cancer  . Diabetes Maternal Grandmother    History  Substance Use Topics  . Smoking status: Never Smoker   . Smokeless tobacco: Not on file  . Alcohol Use: No   OB History    Gravida Para Term Preterm AB TAB SAB Ectopic Multiple Living   5 5        5      Review of Systems  Respiratory: Positive for cough and shortness of breath.   Cardiovascular:  Negative for chest pain and palpitations.  All other systems reviewed and are negative.     Allergies  Peanuts  Home Medications   Prior to Admission medications   Medication Sig Start Date End Date Taking? Authorizing Provider  FLUoxetine (PROZAC) 20 MG capsule Take 1 capsule (20 mg total) by mouth daily. 11/30/13  Yes Hannah R Kim, DO   BP 109/65 mmHg  Pulse 70  Temp(Src) 98 F (36.7 C) (Oral)  Resp 15  Ht 5\' 7"  (1.702 m)  Wt 239 lb (108.41 kg)  BMI 37.42 kg/m2  SpO2 100%  LMP 01/19/2014 Physical Exam  Constitutional: She is oriented to person, place, and time. She appears well-developed and well-nourished. No distress.  HENT:  Head: Normocephalic and atraumatic.  Right Ear: Hearing normal.  Left Ear: Hearing normal.  Nose: Nose normal.  Mouth/Throat: Oropharynx is clear and moist and mucous membranes are normal.  Eyes: Conjunctivae and EOM are normal. Pupils are equal, round, and reactive to light.  Neck: Normal range of motion. Neck supple.  Cardiovascular: Regular rhythm, S1 normal and S2 normal.  Exam reveals no gallop and no friction rub.   No murmur heard. Pulmonary/Chest: Effort normal and breath sounds normal. No respiratory distress. She exhibits no tenderness.  Abdominal: Soft. Normal appearance and bowel sounds are normal. There is no hepatosplenomegaly. There is no tenderness.  There is no rebound, no guarding, no tenderness at McBurney's point and negative Murphy's sign. No hernia.  Musculoskeletal: Normal range of motion.  Neurological: She is alert and oriented to person, place, and time. She has normal strength. No cranial nerve deficit or sensory deficit. Coordination normal. GCS eye subscore is 4. GCS verbal subscore is 5. GCS motor subscore is 6.  Skin: Skin is warm, dry and intact. No rash noted. No cyanosis.  Psychiatric: She has a normal mood and affect. Her speech is normal and behavior is normal. Thought content normal.  Nursing note and vitals  reviewed.   ED Course  Procedures (including critical care time) Labs Review Labs Reviewed  COMPREHENSIVE METABOLIC PANEL - Abnormal; Notable for the following:    Glucose, Bld 113 (*)    All other components within normal limits  CBC WITH DIFFERENTIAL  TROPONIN I  PRO B NATRIURETIC PEPTIDE  D-DIMER, QUANTITATIVE    Imaging Review Dg Chest 2 View  01/27/2014   CLINICAL DATA:  Shortness of breath and cough.  EXAM: CHEST  2 VIEW  COMPARISON:  PA and lateral chest 06/29/2012.  FINDINGS: Heart size and mediastinal contours are within normal limits. Both lungs are clear. Visualized skeletal structures are unremarkable.  IMPRESSION: Negative exam.   Electronically Signed   By: Inge Rise M.D.   On: 01/27/2014 01:48     EKG Interpretation None      MDM   Final diagnoses:  Shortness of breath    Presented to the ER for evaluation of shortness of breath. Patient did not have any chest pain associated with the symptoms. She was not hypoxic upon arrival. Vital signs were all stable and normal for her. Blood work is unremarkable. An EKG was performed, no abnormalities seen. Troponin I was negative.no evidence of congestive heart failure. A d-dimer was also obtained, this was negative. Patient has had resolution of her symptoms here in the ER without any intervention. She might have had some transient anxiety. Patient also has had a slight cough,shortness of breath possibly symptoms secondary toURI. As patient is feeling better, can be discharged, follow up as needed with primary doctor.    Orpah Greek, MD 01/27/14 [phone]

## 2014-01-27 NOTE — ED Notes (Signed)
Patient transported to X-ray 

## 2014-01-27 NOTE — ED Notes (Signed)
Pt. reports SOB with occasional dry cough onset this evening , denies chest pain , no cough or congestion , denies fever or chills.

## 2014-01-27 NOTE — ED Notes (Signed)
Patient is alert and orientedx4.  Patient was explained discharge instructions and they understood them with no questions.  The patient's son, Amanda Bridges is taking the patient home.

## 2014-04-11 ENCOUNTER — Ambulatory Visit: Payer: 59 | Admitting: Family Medicine

## 2014-04-11 NOTE — Progress Notes (Signed)
NO SHOW

## 2014-05-29 ENCOUNTER — Ambulatory Visit (INDEPENDENT_AMBULATORY_CARE_PROVIDER_SITE_OTHER): Payer: 59 | Admitting: Family Medicine

## 2014-05-29 ENCOUNTER — Encounter: Payer: Self-pay | Admitting: Family Medicine

## 2014-05-29 VITALS — BP 118/80 | HR 88 | Temp 98.5°F | Ht 67.0 in | Wt 224.8 lb

## 2014-05-29 DIAGNOSIS — F4323 Adjustment disorder with mixed anxiety and depressed mood: Secondary | ICD-10-CM

## 2014-05-29 MED ORDER — FLUOXETINE HCL 20 MG PO CAPS: 40.0000 mg | ORAL_CAPSULE | Freq: Every day | ORAL | Status: DC

## 2014-05-29 NOTE — Progress Notes (Addendum)
HPI:  Amanda Bridges is a pleasant 45 yo F with PMH sig DM, HLD and depression here for an acute visit for anxiety. She has a history of poor compliance with appointments and treatment recommendations and did not show up for her last visit.  GAD/Depression: -intermittent SSRI use in the past, restarted prozac 9.2015 - worked well in the past -reports: has been worsening lately as her son's birthday approaches the last few weeks, feels overwhelmed at times, feels depression, sometimes feels like she can't focus, cries frequently, stress at work, poor sleep, irritable at times, +anhedonia -denies: SI, thoughts of self harm, CP, SOB -no counseling -no exercise   ROS: See pertinent positives and negatives per HPI.  Past Medical History  Diagnosis Date  . Depression   . Anemia   . Heavy menstrual bleeding   . Hyperlipidemia   . Diabetes mellitus without complication     Past Surgical History  Procedure Laterality Date  . Ovarian cyst removal      benign cyst  . Breast biopsy  2009    Family History  Problem Relation Age of Onset  . Cancer Mother     lung cancer, smoker  . Diabetes Mother   . Diabetes Maternal Aunt   . Cancer Maternal Aunt     stomach cancer  . Diabetes Maternal Grandmother     History   Social History  . Marital Status: Single    Spouse Name: N/A  . Number of Children: N/A  . Years of Education: N/A   Social History Main Topics  . Smoking status: Never Smoker   . Smokeless tobacco: Not on file  . Alcohol Use: No  . Drug Use: No  . Sexual Activity: Yes     Comment: TUBAL LIGATION   Other Topics Concern  . 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  FLUoxetine  (PROZAC) 20 MG capsule, Take 2 capsules (40 mg total) by mouth daily., Disp: 60 capsule, Rfl: 1  EXAM:  Filed Vitals:   05/29/14 1357  BP: 118/80  Pulse: 88  Temp: 98.5 F (36.9 C)    Body mass index is 35.2 kg/(m^2).  GENERAL: vitals reviewed and listed above, alert, oriented, appears well hydrated and in no acute distress  HEENT: atraumatic, conjunttiva clear, no obvious abnormalities on inspection of external nose and ears  NECK: no obvious masses on inspection  LUNGS: clear to auscultation bilaterally, no wheezes, rales or rhonchi, good air movement  CV: HRRR, no peripheral edema  MS: moves all extremities without noticeable abnormality  PSYCH: pleasant and cooperative, depressed mood  ASSESSMENT AND PLAN:  Discussed the following assessment and plan:  Adjustment reaction with anxiety and depression - Plan: FLUoxetine (PROZAC) 20 MG capsule  -Patient advised to return or notify a doctor immediately if symptoms worsen or persist or new concerns arise.  Patient Instructions  BEFORE YOU LEAVE: -schedule follow up appointment in 1 month - come fasting to that appointment  Call to schedule counseling today  Increase Prozac to 40 mg daily  Seek care immediately if worsening, thoughts of self harm or  other concerns         Colin Benton R.

## 2014-05-29 NOTE — Addendum Note (Signed)
Addended by: Lucretia Kern on: 05/29/2014 02:21 PM   Modules accepted: Level of Service

## 2014-05-29 NOTE — Progress Notes (Signed)
Pre visit review using our clinic review tool, if applicable. No additional management support is needed unless otherwise documented below in the visit note. 

## 2014-05-29 NOTE — Patient Instructions (Addendum)
BEFORE YOU LEAVE: -schedule follow up appointment in 1 month - come fasting to that appointment  Call to schedule counseling today  Increase Prozac to 40 mg daily  Seek care immediately if worsening, thoughts of self harm or other concerns

## 2014-05-31 ENCOUNTER — Ambulatory Visit: Payer: Self-pay | Admitting: Family Medicine

## 2014-06-29 ENCOUNTER — Ambulatory Visit: Payer: 59 | Admitting: Family Medicine

## 2014-07-26 ENCOUNTER — Other Ambulatory Visit: Payer: Self-pay | Admitting: Family Medicine

## 2014-08-07 ENCOUNTER — Other Ambulatory Visit: Payer: Self-pay | Admitting: Family Medicine

## 2014-09-14 ENCOUNTER — Ambulatory Visit (INDEPENDENT_AMBULATORY_CARE_PROVIDER_SITE_OTHER): Payer: 59 | Admitting: Family Medicine

## 2014-09-14 ENCOUNTER — Encounter: Payer: Self-pay | Admitting: Family Medicine

## 2014-09-14 VITALS — BP 110/80 | HR 88 | Temp 98.1°F | Ht 67.0 in | Wt 236.5 lb

## 2014-09-14 DIAGNOSIS — E119 Type 2 diabetes mellitus without complications: Secondary | ICD-10-CM

## 2014-09-14 DIAGNOSIS — Z23 Encounter for immunization: Secondary | ICD-10-CM | POA: Diagnosis not present

## 2014-09-14 DIAGNOSIS — F39 Unspecified mood [affective] disorder: Secondary | ICD-10-CM

## 2014-09-14 DIAGNOSIS — Z1239 Encounter for other screening for malignant neoplasm of breast: Secondary | ICD-10-CM | POA: Diagnosis not present

## 2014-09-14 DIAGNOSIS — E785 Hyperlipidemia, unspecified: Secondary | ICD-10-CM

## 2014-09-14 LAB — BASIC METABOLIC PANEL
BUN: 15 mg/dL (ref 6–23)
CHLORIDE: 103 meq/L (ref 96–112)
CO2: 27 mEq/L (ref 19–32)
Calcium: 9.3 mg/dL (ref 8.4–10.5)
Creatinine, Ser: 0.75 mg/dL (ref 0.40–1.20)
GFR: 107.9 mL/min (ref >60.00)
Glucose, Bld: 116 mg/dL — ABNORMAL HIGH (ref 70–99)
POTASSIUM: 4 meq/L (ref 3.5–5.1)
Sodium: 136 mEq/L (ref 135–145)

## 2014-09-14 LAB — LIPID PANEL
CHOLESTEROL: 169 mg/dL (ref 0–200)
HDL: 36.5 mg/dL — AB (ref >39.00)
LDL CALC: 111 mg/dL — AB (ref 0–99)
NonHDL: 132.5
TRIGLYCERIDES: 110 mg/dL (ref 0.0–149.0)
Total CHOL/HDL Ratio: 5
VLDL: 22 mg/dL (ref 0.0–40.0)

## 2014-09-14 LAB — HEMOGLOBIN A1C: HEMOGLOBIN A1C: 6.5 % (ref 4.6–6.5)

## 2014-09-14 MED ORDER — FLUOXETINE HCL 20 MG PO CAPS: ORAL_CAPSULE | ORAL | Status: DC

## 2014-09-14 NOTE — Progress Notes (Signed)
HPI:  Amanda Bridges is a pleasant 44 yo F with PMH sig DM, HLD and depression here for an acute visit for anxiety. She has a history of poor compliance with appointments and treatment recommendations and did not show up for her last visit and other appts in the past.  GAD/Depression: -restarted SSRI last visit, she missed her f/u appt -reports she is doing much better -reports: she is doing much better -denies: SI, thoughts of self harm, CP, SOB -seeing counselor through her job   Diabetes: -new dx in 2015 -lifestyle recs and referral to nutrition; she preferred to work on liffestyle only at first and was successful, did not follow up and needs labs -doing boot camp burn - just started 4 days ago; she also is working on diet as well -denies: polyuria, polydipsia -offered pneumonia vaccine, she declined  HLD: -lipids not at goal for diabetic pt -working on diet and exercise  HM: see chart need many items  ROS: See pertinent positives and negatives per HPI.  Past Medical History  Diagnosis Date  . Depression   . Anemia   . Heavy menstrual bleeding   . Hyperlipidemia   . Diabetes mellitus without complication     Past Surgical History  Procedure Laterality Date  . Ovarian cyst removal      benign cyst  . Breast biopsy  2009    Family History  Problem Relation Age of Onset  . Cancer Mother     lung cancer, smoker  . Diabetes Mother   . Diabetes Maternal Aunt   . Cancer Maternal Aunt     stomach cancer  . Diabetes Maternal Grandmother     History   Social History  . Marital Status: Single    Spouse Name: N/A  . Number of Children: N/A  . Years of Education: N/A   Social History Main Topics  . Smoking status: Never Smoker   . Smokeless tobacco: Not on file  . Alcohol Use: No  . Drug Use: No  . Sexual Activity: Yes     Comment: TUBAL LIGATION   Other Topics Concern  . 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  FLUoxetine (PROZAC) 20 MG capsule, TAKE 2 CAPSULES (40 MG TOTAL) BY MOUTH DAILY., Disp: 60 capsule, Rfl: 3  EXAM:  Filed Vitals:   09/14/14 1024  BP: 110/80  Pulse: 88  Temp: 98.1 F (36.7 C)    Body mass index is 37.03 kg/(m^2).  GENERAL: vitals reviewed and listed above, alert, oriented, appears well hydrated and in no acute distress  HEENT: atraumatic, conjunttiva clear, no obvious abnormalities on inspection of external nose and ears  NECK: no obvious masses on inspection  LUNGS: clear to auscultation bilaterally, no wheezes, rales or rhonchi, good air movement  CV: HRRR, no peripheral edema  MS: moves all extremities without noticeable abnormality  FOOT exam done  PSYCH: pleasant and cooperative, no obvious depression or anxiety  ASSESSMENT AND PLAN:  Discussed the following assessment and plan:  Type 2 diabetes mellitus without complication - Plan: Ambulatory referral to Ophthalmology  Hyperlipemia  Depression, GAD  Breast cancer screening - Plan: MM Digital Screening  -pneumonia vaccine -labs -lifestyle counseling -referred for diabetic eye exam and mammogram -refilled medication -follow up in 3-4 months -Patient advised to return or notify a doctor immediately if symptoms worsen or persist or new concerns arise.  Patient Instructions  BEFORE YOU LEAVE: -labs -pneumococcal -follow up appointment in 3 months  -We placed a referral for you as discussed for your mammogram and for your diabetic eye exam. Please make sure you keep these appointments. It usually takes about 1-2 weeks to process and schedule this referral. If you have not heard from [place] regarding this appointment in 2 weeks please contact our  office.      Colin Benton R.

## 2014-09-14 NOTE — Addendum Note (Signed)
Addended by: Lahoma Crocker A on: 09/14/2014 11:00 AM   Modules accepted: Orders

## 2014-09-14 NOTE — Addendum Note (Signed)
Addended by: Lucretia Kern on: 09/14/2014 11:06 AM   Modules accepted: Orders

## 2014-09-14 NOTE — Patient Instructions (Addendum)
BEFORE YOU LEAVE: -labs -pneumococcal -follow up appointment in 3 months  -We placed a referral for you as discussed for your mammogram and for your diabetic eye exam. Please make sure you keep these appointments. It usually takes about 1-2 weeks to process and schedule this referral. If you have not heard from [place] regarding this appointment in 2 weeks please contact our office.

## 2014-09-14 NOTE — Progress Notes (Signed)
Pre visit review using our clinic review tool, if applicable. No additional management support is needed unless otherwise documented below in the visit note. 

## 2014-09-20 MED ORDER — PRAVASTATIN SODIUM 20 MG PO TABS: 20.0000 mg | ORAL_TABLET | Freq: Every day | ORAL | Status: AC

## 2014-09-20 NOTE — Addendum Note (Signed)
Addended by: Lahoma Crocker A on: 09/20/2014 11:10 AM   Modules accepted: Orders

## 2014-09-29 ENCOUNTER — Encounter (INDEPENDENT_AMBULATORY_CARE_PROVIDER_SITE_OTHER): Payer: 59 | Admitting: Ophthalmology

## 2014-09-29 DIAGNOSIS — E119 Type 2 diabetes mellitus without complications: Secondary | ICD-10-CM

## 2014-09-29 DIAGNOSIS — E11319 Type 2 diabetes mellitus with unspecified diabetic retinopathy without macular edema: Secondary | ICD-10-CM

## 2014-09-29 DIAGNOSIS — H43813 Vitreous degeneration, bilateral: Secondary | ICD-10-CM | POA: Diagnosis not present

## 2014-09-29 LAB — HM DIABETES EYE EXAM

## 2014-10-03 ENCOUNTER — Encounter: Payer: Self-pay | Admitting: Family Medicine

## 2014-12-15 ENCOUNTER — Ambulatory Visit (INDEPENDENT_AMBULATORY_CARE_PROVIDER_SITE_OTHER): Payer: 59 | Admitting: Family Medicine

## 2014-12-15 ENCOUNTER — Encounter: Payer: Self-pay | Admitting: Family Medicine

## 2014-12-15 DIAGNOSIS — R69 Illness, unspecified: Secondary | ICD-10-CM

## 2014-12-15 DIAGNOSIS — D509 Iron deficiency anemia, unspecified: Secondary | ICD-10-CM

## 2014-12-15 DIAGNOSIS — F411 Generalized anxiety disorder: Secondary | ICD-10-CM

## 2014-12-15 DIAGNOSIS — F329 Major depressive disorder, single episode, unspecified: Secondary | ICD-10-CM | POA: Insufficient documentation

## 2014-12-15 DIAGNOSIS — F3342 Major depressive disorder, recurrent, in full remission: Secondary | ICD-10-CM

## 2014-12-15 DIAGNOSIS — E669 Obesity, unspecified: Secondary | ICD-10-CM

## 2014-12-15 DIAGNOSIS — E119 Type 2 diabetes mellitus without complications: Secondary | ICD-10-CM | POA: Insufficient documentation

## 2014-12-15 NOTE — Progress Notes (Signed)
NO SHOW  On review of chart prior to visit:  Amanda Bridges is a pleasant 44 yo F with PMH sig DM, HLD and depression. She has a history of poor compliance with appointments and treatment recommendations.  GAD/Depression: -meds: prozac 20mg  -reports:  -denies:  -seeing counselor through her job   Diabetes: -new dx in 2015 -lifestyle recs and referral to nutrition; she preferred to work on lifestyle only -doing boot camp burn; she also is working on diet as well -denies:  -offered pneumonia vaccine, she declined  HLD: -meds: pravastatin 20mg  -lipids not at goal for diabetic pt -working on diet and exercise

## 2015-06-05 IMAGING — CR DG CHEST 2V
2 series · 2 of 2 positions shown · non-contrast
Comparison: PA and lateral chest 06/29/2012.

CLINICAL DATA: Shortness of breath and cough.

EXAM:
CHEST  2 VIEW

[w chest pa]
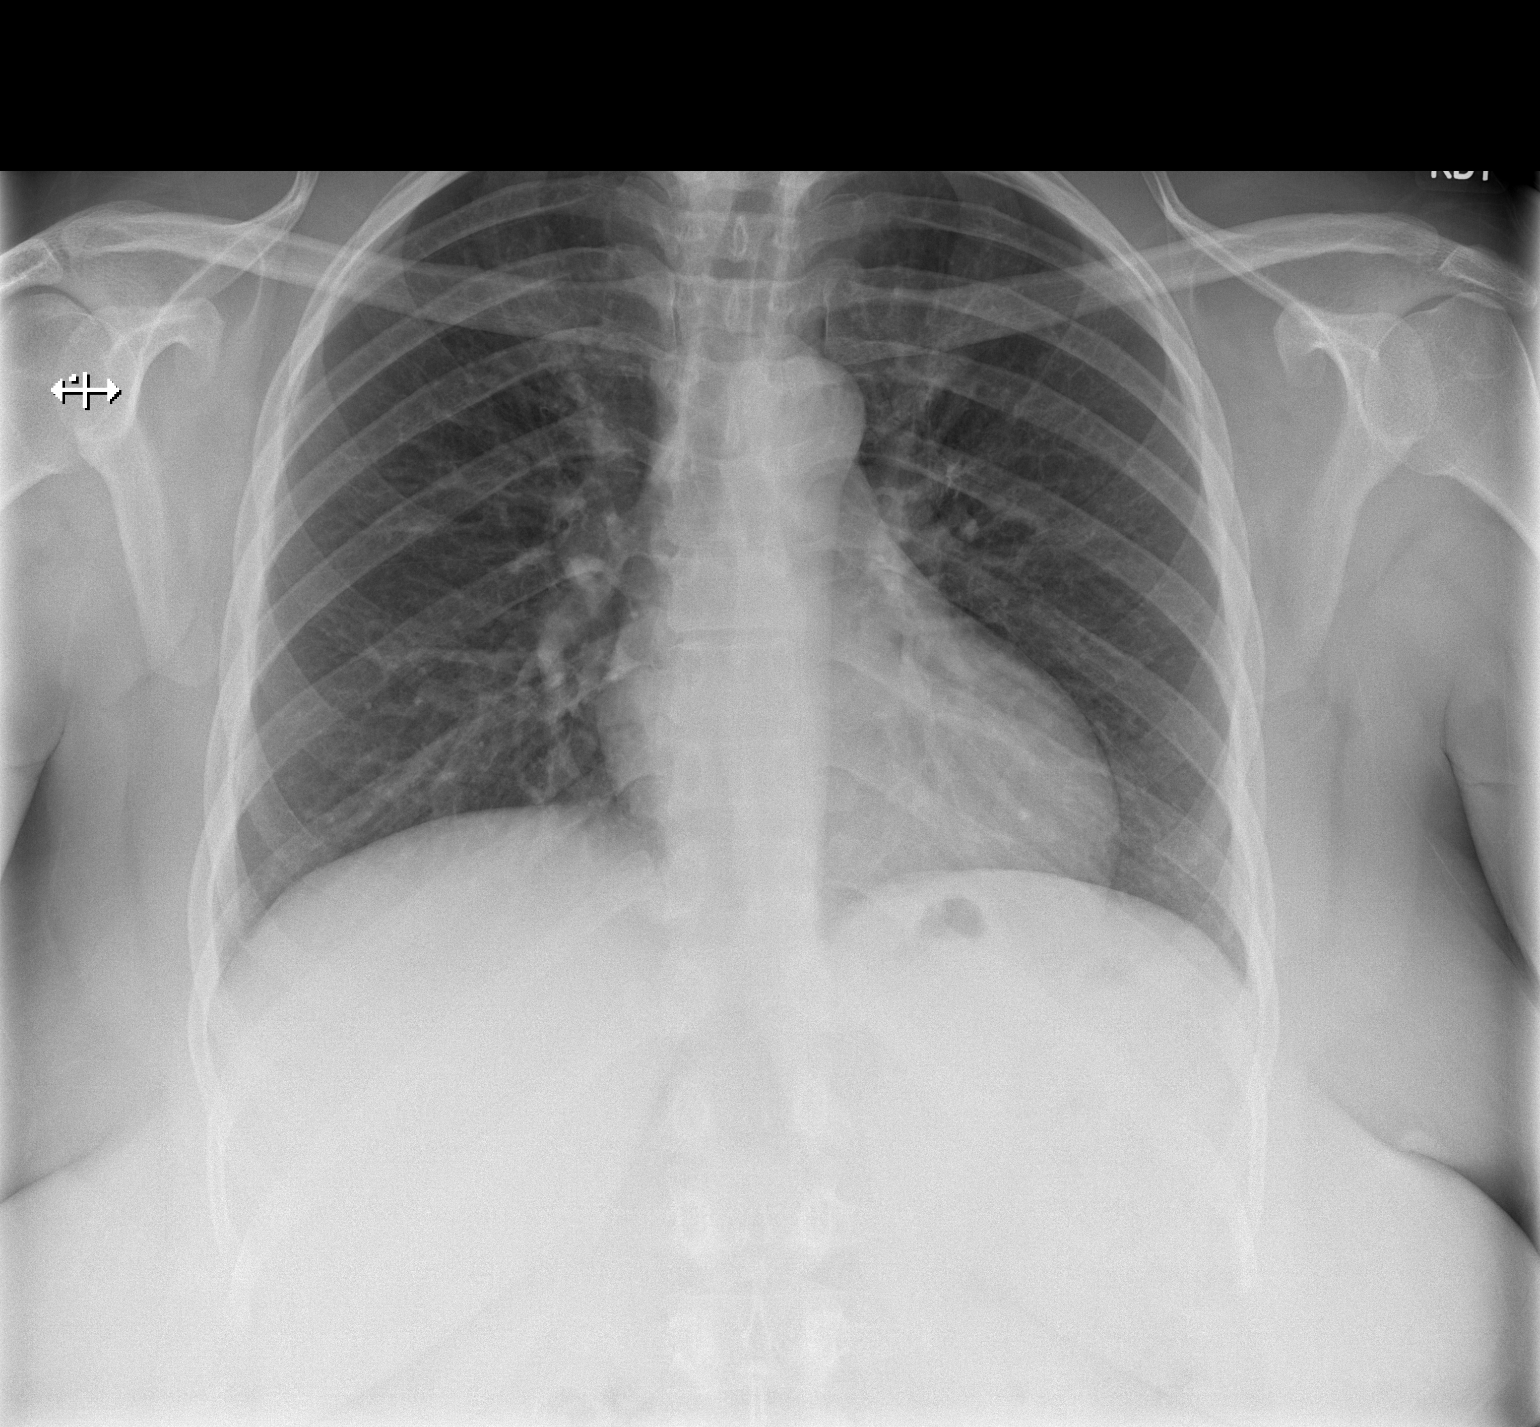

[w chest lat]
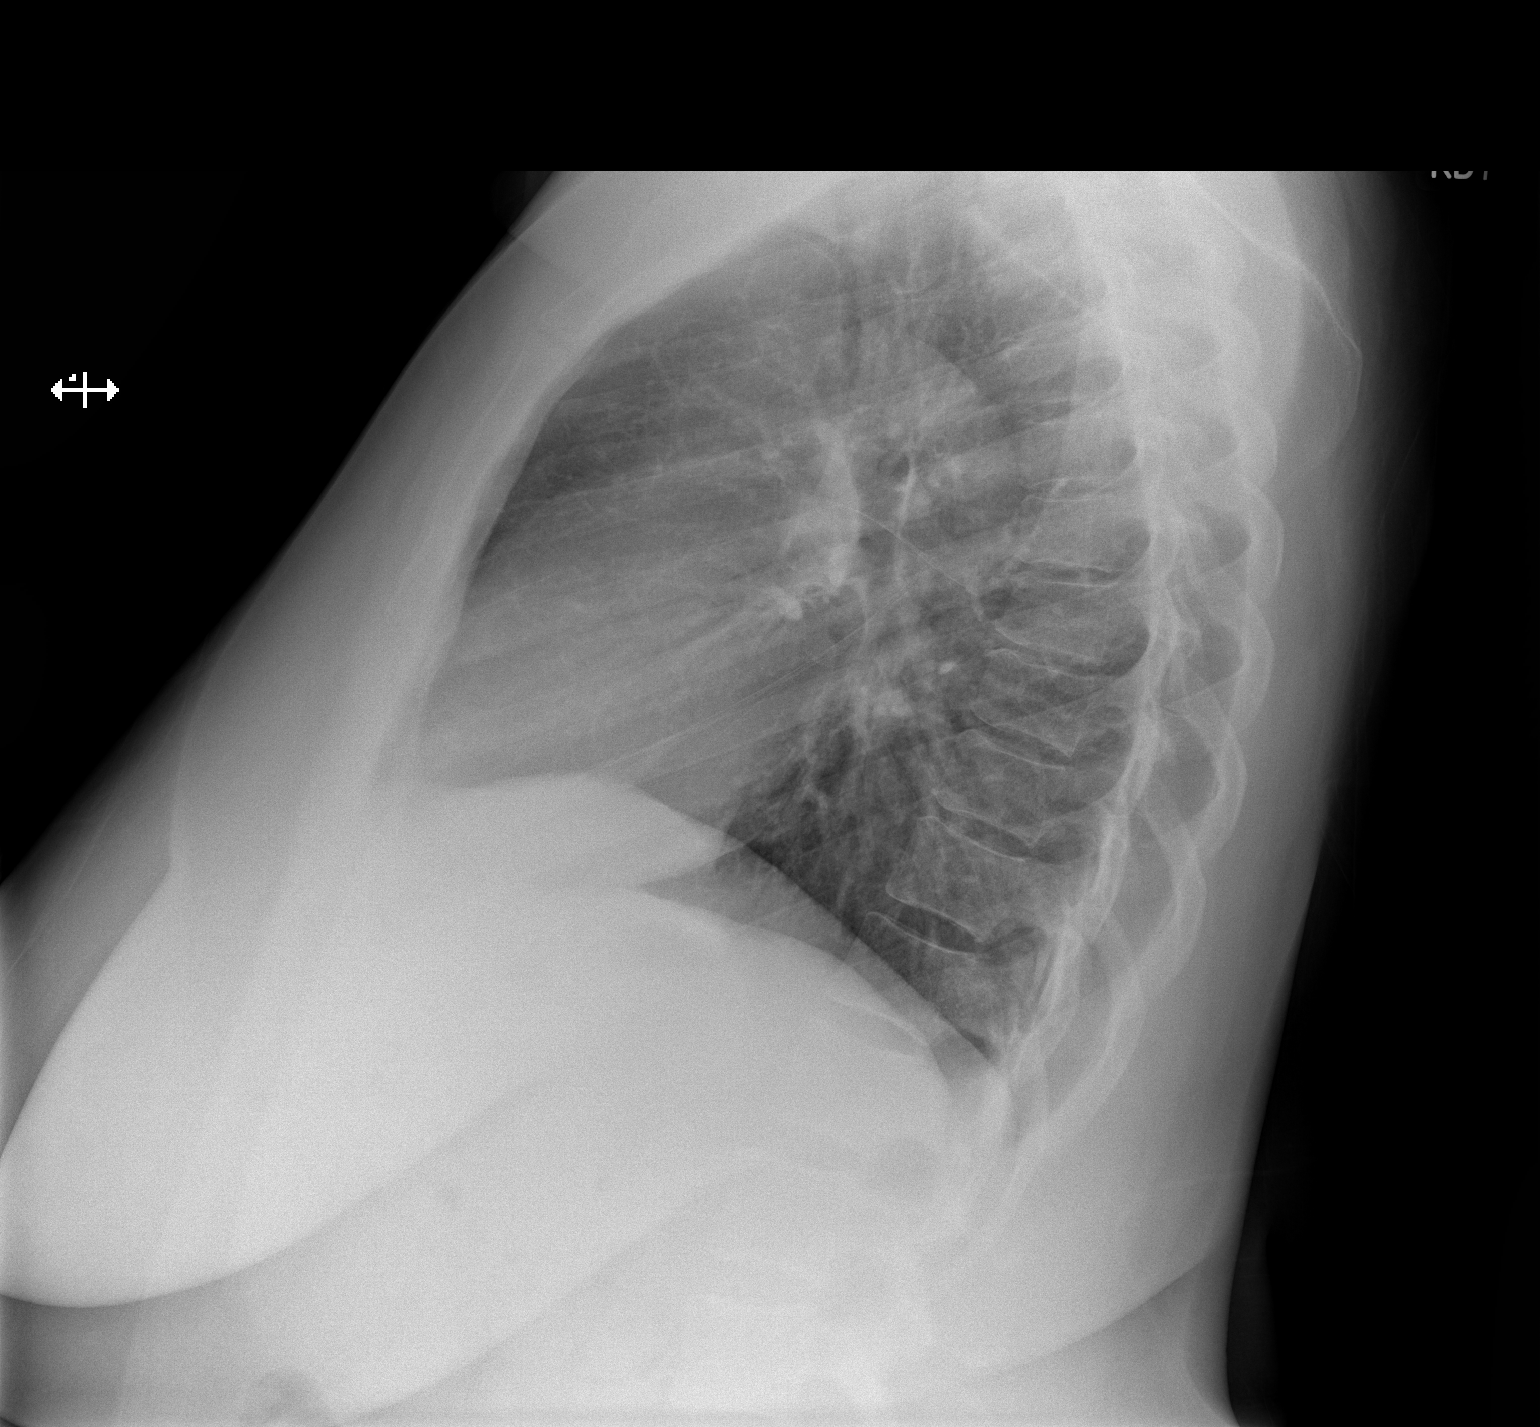

[2 of 2 positions shown; findings below may reference images not displayed]

FINDINGS: Heart size and mediastinal contours are within normal limits. Both
lungs are clear. Visualized skeletal structures are unremarkable.
IMPRESSION: Negative exam.

## 2015-11-02 ENCOUNTER — Other Ambulatory Visit: Payer: Self-pay | Admitting: Family Medicine

## 2015-11-29 ENCOUNTER — Emergency Department (HOSPITAL_COMMUNITY)
Admission: EM | Admit: 2015-11-29 | Discharge: 2015-11-29 | Disposition: A | Discharge status: Home / Self Care | Payer: No Typology Code available for payment source | Attending: Emergency Medicine | Admitting: Emergency Medicine

## 2015-11-29 ENCOUNTER — Emergency Department (HOSPITAL_COMMUNITY): Payer: No Typology Code available for payment source

## 2015-11-29 ENCOUNTER — Encounter (HOSPITAL_COMMUNITY): Payer: Self-pay | Admitting: Emergency Medicine

## 2015-11-29 DIAGNOSIS — M545 Low back pain, unspecified: Secondary | ICD-10-CM

## 2015-11-29 DIAGNOSIS — E119 Type 2 diabetes mellitus without complications: Secondary | ICD-10-CM | POA: Insufficient documentation

## 2015-11-29 DIAGNOSIS — Y9389 Activity, other specified: Secondary | ICD-10-CM | POA: Insufficient documentation

## 2015-11-29 DIAGNOSIS — Z79899 Other long term (current) drug therapy: Secondary | ICD-10-CM | POA: Diagnosis not present

## 2015-11-29 DIAGNOSIS — Y92410 Unspecified street and highway as the place of occurrence of the external cause: Secondary | ICD-10-CM | POA: Insufficient documentation

## 2015-11-29 DIAGNOSIS — Y999 Unspecified external cause status: Secondary | ICD-10-CM | POA: Diagnosis not present

## 2015-11-29 LAB — PREGNANCY, URINE: Preg Test, Ur: NEGATIVE

## 2015-11-29 MED ORDER — CYCLOBENZAPRINE HCL 10 MG PO TABS: 10.0000 mg | ORAL_TABLET | Freq: Every day | ORAL | 0 refills | Status: AC

## 2015-11-29 MED ORDER — IBUPROFEN 200 MG PO TABS
600.0000 mg | ORAL_TABLET | Freq: Once | ORAL | Status: AC
  Administered 2015-11-29: 600 mg via ORAL
  Filled 2015-11-29: qty 3

## 2015-11-29 MED ORDER — MELOXICAM 15 MG PO TABS: 15.0000 mg | ORAL_TABLET | Freq: Every day | ORAL | 0 refills | Status: DC
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
## 2015-11-29 NOTE — ED Notes (Signed)
Bed: WA30 Expected date:  Expected time:  Means of arrival:  Comments: 
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
## 2015-11-29 NOTE — Discharge Instructions (Signed)
Take the Mobic and Flexeril as prescribed. Be sure to eat when taking the Mobic as it can be hard on your stomach. Do not drive, operate machinery, or drink alcohol while taking the Flexeril as it can cause drowsiness. Follow-up with your primary care physician in 2 days if your symptoms are not improving. Use alternating ice and heat on your back.  Return immediately to the emergency department if you experience worsening headache, dizziness, visual changes, abdominal pain, nausea, vomiting, numbness of your inner thighs, inability to urinate, loss of bowel bladder function, numbness/tingling or weakness of your legs, or any other concerning symptoms.

## 2016-03-09 ENCOUNTER — Other Ambulatory Visit: Payer: Self-pay | Admitting: Family Medicine

## 2016-03-11 ENCOUNTER — Other Ambulatory Visit: Payer: Self-pay | Admitting: Emergency Medicine

## 2016-03-11 NOTE — Telephone Encounter (Signed)
Patient would like a refill. Please Advise.

## 2016-03-14 NOTE — Telephone Encounter (Signed)
Left message for patient to call the office back in regards of medication refill (Prozac). Pt needs to make an appointment for further refills.

## 2016-03-20 ENCOUNTER — Other Ambulatory Visit (HOSPITAL_COMMUNITY)
Admission: RE | Admit: 2016-03-20 | Discharge: 2016-03-20 | Disposition: A | Discharge status: Home / Self Care | Payer: 59 | Source: Ambulatory Visit | Attending: Nurse Practitioner | Admitting: Nurse Practitioner

## 2016-03-20 ENCOUNTER — Other Ambulatory Visit: Payer: Self-pay | Admitting: Nurse Practitioner

## 2016-03-20 DIAGNOSIS — Z113 Encounter for screening for infections with a predominantly sexual mode of transmission: Secondary | ICD-10-CM | POA: Diagnosis present

## 2016-03-20 DIAGNOSIS — Z1151 Encounter for screening for human papillomavirus (HPV): Secondary | ICD-10-CM | POA: Insufficient documentation

## 2016-03-20 DIAGNOSIS — Z01419 Encounter for gynecological examination (general) (routine) without abnormal findings: Secondary | ICD-10-CM | POA: Insufficient documentation

## 2016-03-26 LAB — CYTOLOGY - PAP
BACTERIAL VAGINITIS: POSITIVE — AB
CANDIDA VAGINITIS: NEGATIVE
Diagnosis: NEGATIVE
HPV: NOT DETECTED
Trichomonas: NEGATIVE

## 2017-09-10 ENCOUNTER — Other Ambulatory Visit: Payer: Self-pay | Admitting: Nurse Practitioner

## 2017-09-10 DIAGNOSIS — Z1231 Encounter for screening mammogram for malignant neoplasm of breast: Secondary | ICD-10-CM

## 2017-09-30 ENCOUNTER — Ambulatory Visit
Admission: RE | Admit: 2017-09-30 | Discharge: 2017-09-30 | Disposition: A | Discharge status: Home / Self Care | Payer: 59 | Source: Ambulatory Visit | Attending: Nurse Practitioner | Admitting: Nurse Practitioner

## 2017-09-30 DIAGNOSIS — Z1231 Encounter for screening mammogram for malignant neoplasm of breast: Secondary | ICD-10-CM

## 2017-10-02 ENCOUNTER — Other Ambulatory Visit: Payer: Self-pay | Admitting: Nurse Practitioner

## 2017-10-02 DIAGNOSIS — R928 Other abnormal and inconclusive findings on diagnostic imaging of breast: Secondary | ICD-10-CM

## 2017-10-06 ENCOUNTER — Other Ambulatory Visit: Payer: 59

## 2017-10-14 ENCOUNTER — Ambulatory Visit
Admission: RE | Admit: 2017-10-14 | Discharge: 2017-10-14 | Disposition: A | Discharge status: Home / Self Care | Payer: 59 | Source: Ambulatory Visit | Attending: Nurse Practitioner | Admitting: Nurse Practitioner

## 2017-10-14 DIAGNOSIS — R928 Other abnormal and inconclusive findings on diagnostic imaging of breast: Secondary | ICD-10-CM

## 2018-12-24 ENCOUNTER — Other Ambulatory Visit: Payer: Self-pay | Admitting: Rheumatology

## 2018-12-24 DIAGNOSIS — Z1231 Encounter for screening mammogram for malignant neoplasm of breast: Secondary | ICD-10-CM

## 2019-02-06 IMAGING — MG DIGITAL SCREENING BILATERAL MAMMOGRAM WITH CAD
6 series · 6 of 6 positions shown · non-contrast
Comparison: Previous exam(s).

CLINICAL DATA: Screening.

EXAM:
DIGITAL SCREENING BILATERAL MAMMOGRAM WITH CAD

[R CC]
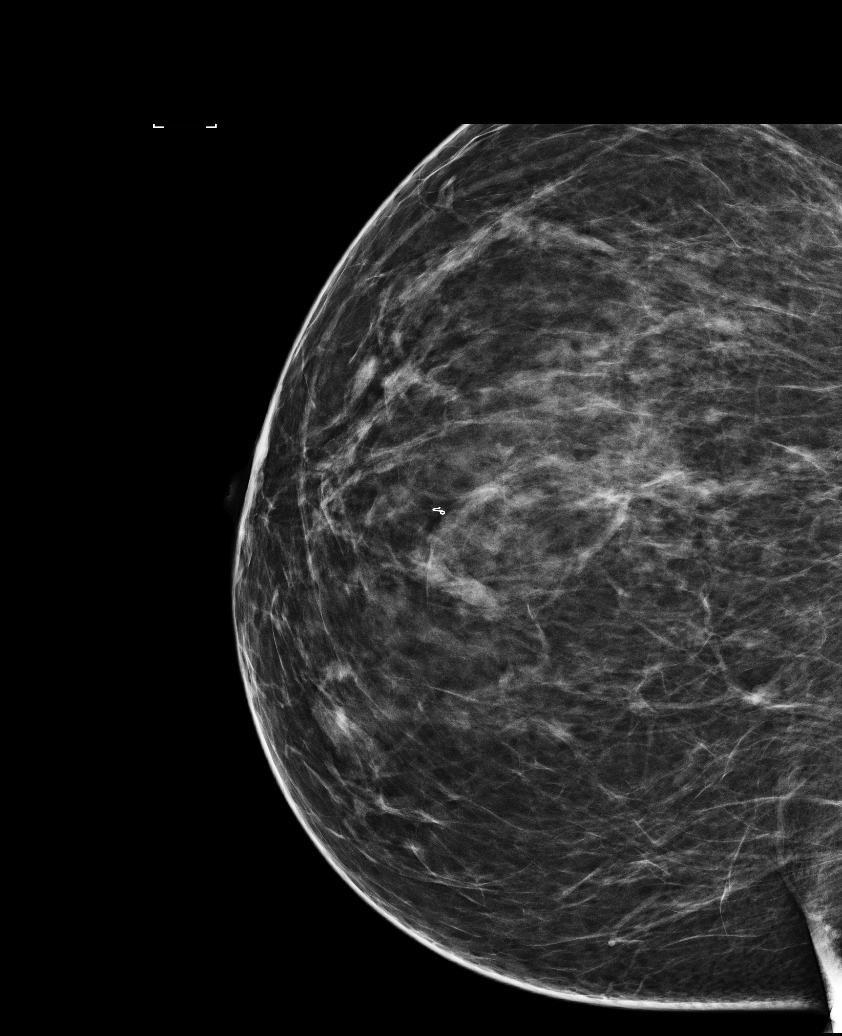

[R XCCL]
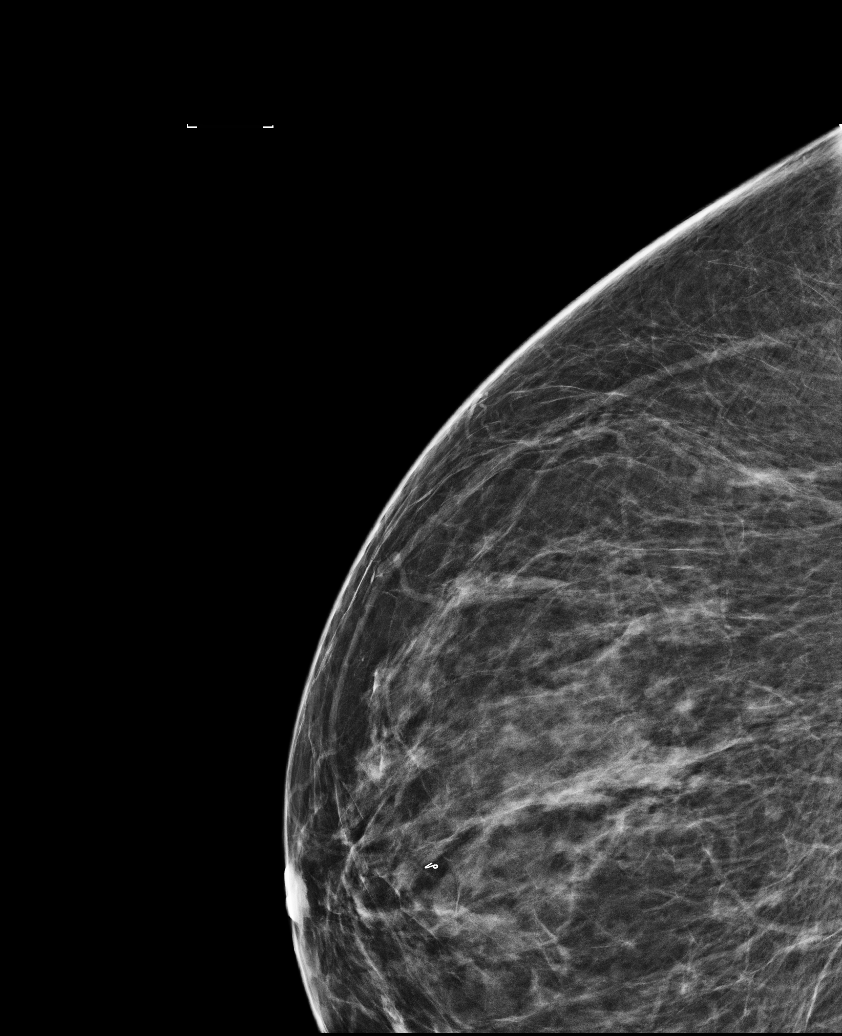

[L MLO]
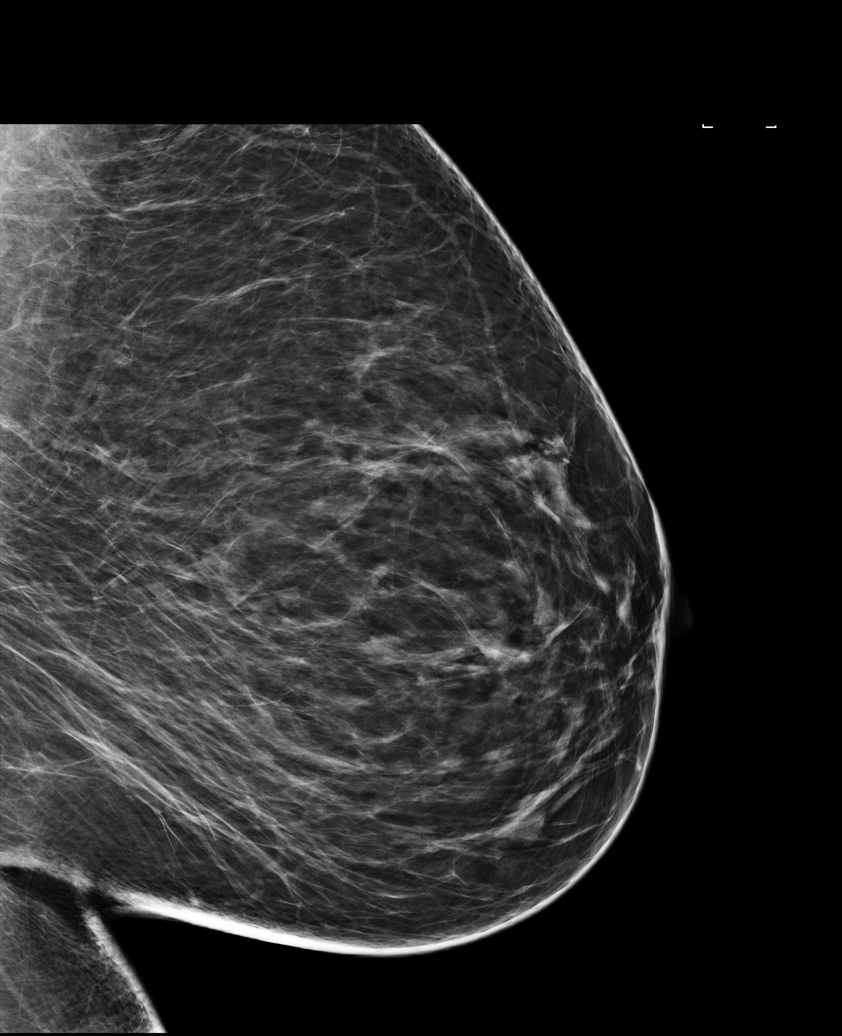

[L CC]
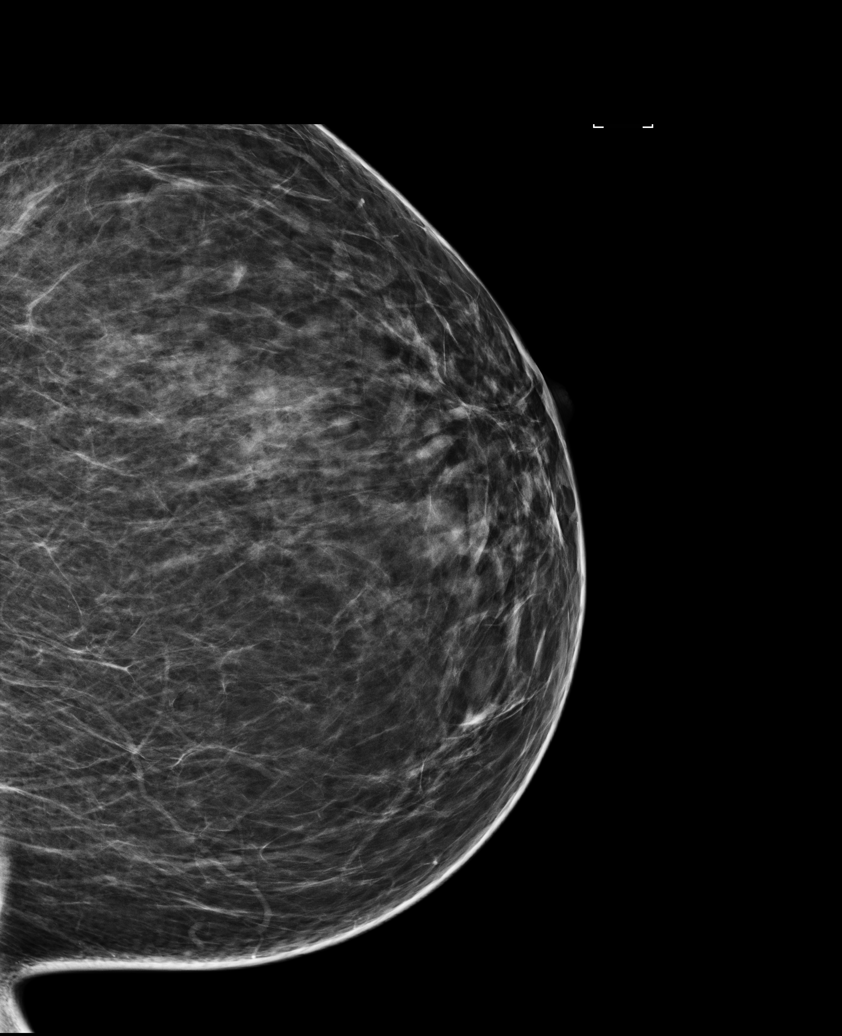

[R MLO]
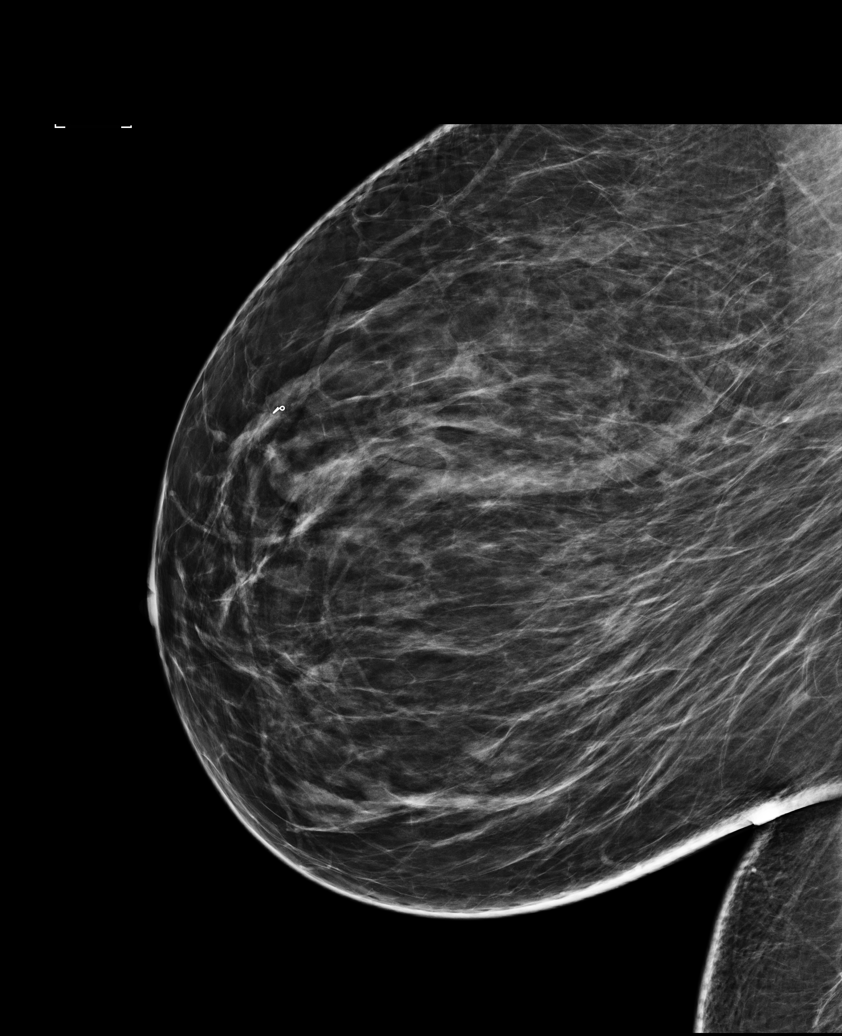

[L XCCL]
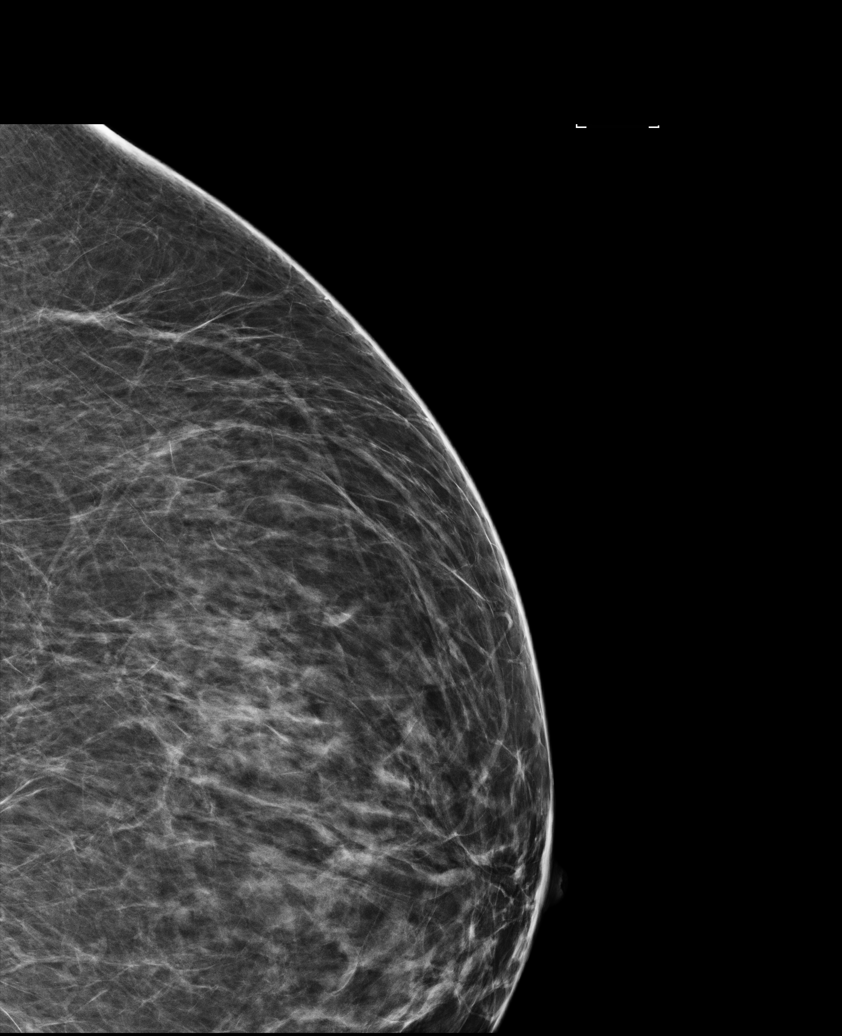

[6 of 6 positions shown; findings below may reference images not displayed]

ACR Breast Density Category b: There are scattered areas of
fibroglandular density.
FINDINGS: In the right breast possible asymmetry requires further evaluation.

In the left breast possible asymmetry requires further evaluation.

Images were processed with CAD.
IMPRESSION: Further evaluation is suggested for possible asymmetry in the right
breast.

Further evaluation is suggested for possible asymmetry in the left
breast.

RECOMMENDATION:
Diagnostic mammogram and possibly ultrasound of both breasts.
(Code:L2-8-GG4)

The patient will be contacted regarding the findings, and additional
imaging will be scheduled.

BI-RADS CATEGORY  0: Incomplete. Need additional imaging evaluation
and/or prior mammograms for comparison.

## 2019-02-09 ENCOUNTER — Ambulatory Visit: Payer: 59

## 2019-02-20 IMAGING — US ULTRASOUND LEFT BREAST LIMITED
1 series · 4 of 4 positions shown · non-contrast
Comparison: Mammography 09/30/2017, 08/18/2012.

CLINICAL DATA: Recall from 2D screening mammography, possible
developing asymmetries in the LOWER INNER RIGHT breast at ANTERIOR
depth and in the LOWER LEFT breast at ANTERIOR depth.

EXAM:
DIGITAL DIAGNOSTIC BILATERAL MAMMOGRAM WITH CAD AND TOMO
LIMITED ULTRASOUND BILATERAL BREASTS

[Series 1: ultrasound left breast limited · 0.08mm/px · 4 of 4 slices shown]
[im 1/4]
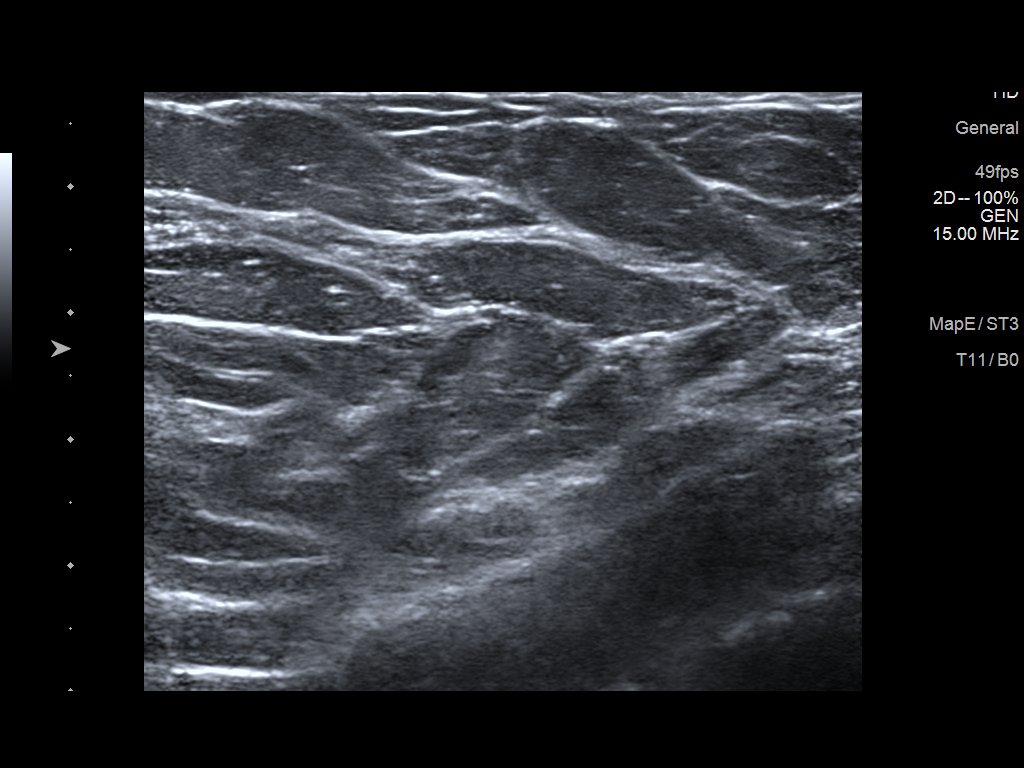
[im 2/4]
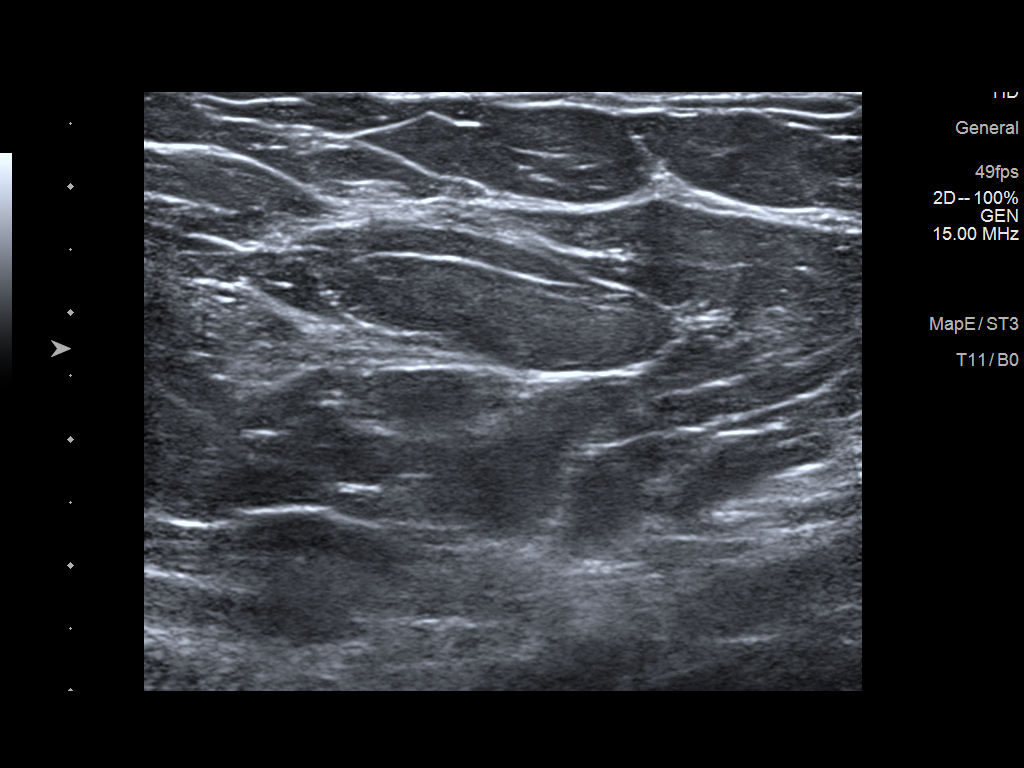
[im 3/4]
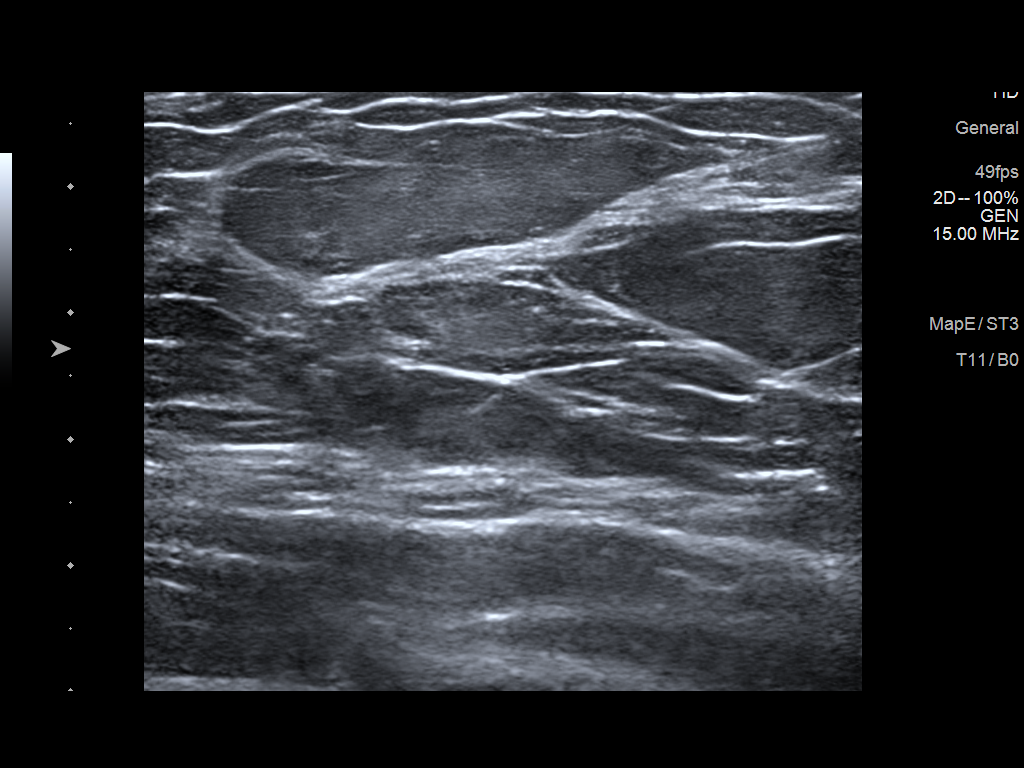
[im 4/4]
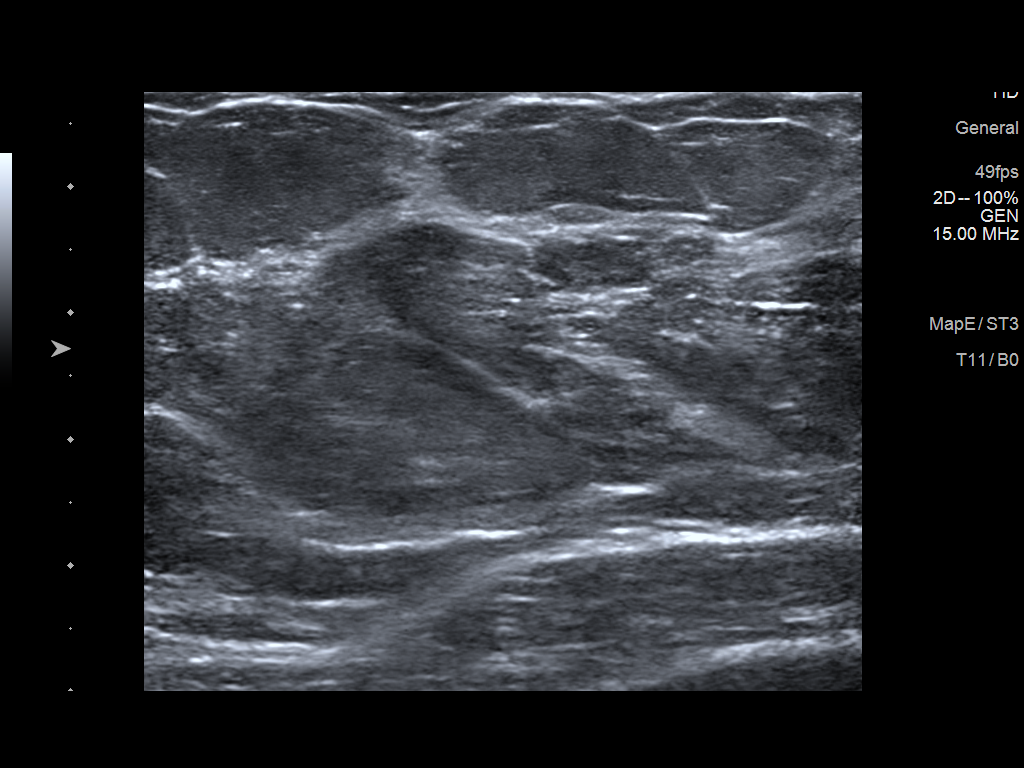

[4 of 4 positions shown; findings below may reference images not displayed]

No prior
ultrasound.

ACR Breast Density Category b: There are scattered areas of
fibroglandular density.
FINDINGS: Tomosynthesis and synthesized full field CC and MLO views of both
breasts were obtained.

The focal asymmetry involving the LOWER INNER RIGHT breast has the
appearance isolated fibroglandular tissue as there is no mass or
architectural distortion. No suspicious findings in the RIGHT
breast.

The focal asymmetry involving the LOWER LEFT breast localizes to the
LOWER INNER QUADRANT and also has the appearance of isolated
fibroglandular tissue as there is no mass or architectural
distortion. No suspicious findings in the LEFT breast.

Mammographic images were processed with CAD.

On physical exam, there is no palpable abnormality in the LOWER or
INNER portion of either the RIGHT or LEFT breast.

Targeted RIGHT breast ultrasound is performed, showing normal
scattered fibroglandular tissue throughout the LOWER INNER QUADRANT
accounting for the screening mammographic finding. No cyst, solid
mass or abnormal acoustic shadowing is identified.

Targeted LEFT breast ultrasound is performed, showing normal
scattered fibroglandular tissue throughout the LOWER INNER QUADRANT
accounting for the screening mammographic finding. No cyst, solid
mass or abnormal acoustic shadowing is identified.
IMPRESSION: 1. No mammographic or sonographic evidence of malignancy involving
either breast.
2. The 2D screening mammographic finding corresponds to normal
scattered fibroglandular tissue in the LOWER INNER quadrants of both
breasts.

RECOMMENDATION:
Screening mammogram in one year.(Code:60-[id number])

I have discussed the findings and recommendations with the patient.
Results were also provided in writing at the conclusion of the
visit. If applicable, a reminder letter will be sent to the patient
regarding the next appointment.

BI-RADS CATEGORY  1: Negative.

## 2019-04-01 ENCOUNTER — Ambulatory Visit: Payer: 59

## 2020-03-30 ENCOUNTER — Other Ambulatory Visit: Payer: Self-pay | Admitting: Obstetrics & Gynecology

## 2020-03-30 DIAGNOSIS — Z1231 Encounter for screening mammogram for malignant neoplasm of breast: Secondary | ICD-10-CM

## 2020-05-05 ENCOUNTER — Ambulatory Visit
Admission: RE | Admit: 2020-05-05 | Discharge: 2020-05-05 | Disposition: A | Discharge status: Home / Self Care | Payer: 59 | Source: Ambulatory Visit | Attending: Obstetrics & Gynecology | Admitting: Obstetrics & Gynecology

## 2020-05-05 ENCOUNTER — Other Ambulatory Visit: Payer: Self-pay

## 2020-05-05 DIAGNOSIS — Z1231 Encounter for screening mammogram for malignant neoplasm of breast: Secondary | ICD-10-CM

## 2020-08-27 ENCOUNTER — Emergency Department (HOSPITAL_COMMUNITY)
Admission: EM | Admit: 2020-08-27 | Discharge: 2020-08-27 | Disposition: A | Discharge status: Home / Self Care | Payer: 59 | Attending: Emergency Medicine | Admitting: Emergency Medicine

## 2020-08-27 ENCOUNTER — Other Ambulatory Visit: Payer: Self-pay

## 2020-08-27 ENCOUNTER — Encounter (HOSPITAL_COMMUNITY): Payer: Self-pay | Admitting: Student

## 2020-08-27 DIAGNOSIS — Z5321 Procedure and treatment not carried out due to patient leaving prior to being seen by health care provider: Secondary | ICD-10-CM | POA: Diagnosis not present

## 2020-08-27 DIAGNOSIS — K0889 Other specified disorders of teeth and supporting structures: Secondary | ICD-10-CM | POA: Diagnosis not present

## 2020-08-27 NOTE — ED Notes (Signed)
Called pt again to sign MSE waiver and also checked outside, still unable to locate the pt. Moving her OTF.

## 2020-08-27 NOTE — ED Triage Notes (Signed)
The pt has had a toothache since Friday advil and tylenol not working

## 2020-08-27 NOTE — ED Notes (Signed)
Pt was here recently to get vitals but in the last 20-30 minutes she hasn't answered for vitals or registrations. Will try again in a few minutes.

## 2021-10-15 ENCOUNTER — Other Ambulatory Visit: Payer: Self-pay | Admitting: Physician Assistant

## 2021-10-15 DIAGNOSIS — Z1231 Encounter for screening mammogram for malignant neoplasm of breast: Secondary | ICD-10-CM

## 2021-10-18 ENCOUNTER — Ambulatory Visit
Admission: RE | Admit: 2021-10-18 | Discharge: 2021-10-18 | Disposition: A | Discharge status: Home / Self Care | Payer: 59 | Source: Ambulatory Visit | Attending: Physician Assistant | Admitting: Physician Assistant

## 2021-10-18 DIAGNOSIS — Z1231 Encounter for screening mammogram for malignant neoplasm of breast: Secondary | ICD-10-CM

## 2022-05-03 ENCOUNTER — Ambulatory Visit
Admission: EM | Admit: 2022-05-03 | Discharge: 2022-05-03 | Disposition: A | Discharge status: Home / Self Care | Payer: 59 | Attending: Urgent Care | Admitting: Urgent Care

## 2022-05-03 DIAGNOSIS — M7061 Trochanteric bursitis, right hip: Secondary | ICD-10-CM

## 2022-05-03 DIAGNOSIS — E119 Type 2 diabetes mellitus without complications: Secondary | ICD-10-CM

## 2022-05-03 MED ORDER — MELOXICAM 15 MG PO TABS: 15.0000 mg | ORAL_TABLET | Freq: Every day | ORAL | 0 refills | Status: AC

## 2022-05-03 NOTE — ED Triage Notes (Signed)
Pt c/o right lateral thigh pain x this am-denies injury-limping gait-NAD

## 2022-05-03 NOTE — ED Provider Notes (Signed)
[facility]  Note:  This document was prepared using Systems analyst and may include unintentional dictation errors.  MRN: [id number] DOB: 02-13-1971  Subjective:   Amanda Bridges is a 52 y.o. female presenting for acute onset this morning of the right upper lateral thigh pain.  Cannot think of any particular aggravating factors.  Does not do a lot of walking.  No trauma.  No rashes.  Patient is an uncontrolled type II diabetic, last A1c was 9.8% in August.  Has not had follow-up.  No insulin use.  No history of heart disease, kidney disease.  No current facility-administered medications for this encounter.  Current Outpatient Medications:    cyclobenzaprine (FLEXERIL) 10 MG tablet, Take 1 tablet (10 mg total) by mouth at bedtime., Disp: 20 tablet, Rfl: 0   FLUoxetine (PROZAC) 20 MG capsule, TAKE 2 CAPSULES BY MOUTH EVERY DAY, Disp: 60 capsule, Rfl: 1   meloxicam (MOBIC) 15 MG tablet, Take 1 tablet (15 mg total) by mouth daily., Disp: 30 tablet, Rfl: 0   pravastatin (PRAVACHOL) 20 MG tablet, Take 1 tablet (20 mg total) by mouth daily., Disp: 90 tablet, Rfl: 3   Allergies  Allergen Reactions   Peanuts [Peanut Oil] Itching    Past Medical History:  Diagnosis Date   Anemia    Depression    Diabetes mellitus without complication (HCC)    Heavy menstrual bleeding    Hydrosalpinx 09/01/2012   right    Hyperlipidemia      Past Surgical History:  Procedure Laterality Date   BREAST BIOPSY  2009   OVARIAN CYST REMOVAL     benign cyst    Family History  Problem Relation Age of Onset   Cancer Mother        lung cancer, smoker   Diabetes Mother    Diabetes Maternal Aunt    Cancer Maternal Aunt        stomach cancer   Diabetes Maternal Grandmother    Breast cancer Neg Hx     Social History   Tobacco Use   Smoking status: Never   Smokeless tobacco: Never  Vaping Use   Vaping Use: Never used  Substance Use Topics   Alcohol use: No    Drug use: No    ROS   Objective:   Vitals: BP 130/80 (BP Location: Right Arm)   Pulse 64   Temp 98.4 F (36.9 C) (Oral)   Resp 16   SpO2 98%   Physical Exam Constitutional:      General: She is not in acute distress.    Appearance: Normal appearance. She is well-developed. She is not ill-appearing, toxic-appearing or diaphoretic.  HENT:     Head: Normocephalic and atraumatic.     Nose: Nose normal.     Mouth/Throat:     Mouth: Mucous membranes are moist.  Eyes:     General: No scleral icterus.       Right eye: No discharge.        Left eye: No discharge.     Extraocular Movements: Extraocular movements intact.  Cardiovascular:     Rate and Rhythm: Normal rate.  Pulmonary:     Effort: Pulmonary effort is normal.  Musculoskeletal:       Legs:  Skin:    General: Skin is warm and dry.  Neurological:     General: No focal deficit present.     Mental Status: She is alert and oriented to person, place, and time.  Psychiatric:        Mood and Affect: Mood normal.        Behavior: Behavior normal.     Assessment and Plan :   PDMP not reviewed this encounter.  1. Trochanteric bursitis of right hip   2. Type 2 diabetes mellitus treated without insulin (HCC)     Unfortunately I am not able to use steroids to help patient with her severe pain from trochanteric bursitis.  Recommended meloxicam 15 mg once daily.  Deferred imaging given lack of trauma, low suspicion for fracture.  Advised that she follow-up with an orthopedist for further management including consideration for local injection, physical therapy as deemed necessary.  Counseled patient on potential for adverse effects with medications prescribed/recommended today, ER and return-to-clinic precautions discussed, patient verbalized understanding.    Jaynee Eagles, [place] 05/03/22 [phone]

## 2023-11-13 ENCOUNTER — Encounter: Payer: Self-pay | Admitting: Physician Assistant

## 2023-11-13 DIAGNOSIS — Z1231 Encounter for screening mammogram for malignant neoplasm of breast: Secondary | ICD-10-CM

## 2023-12-20 ENCOUNTER — Other Ambulatory Visit (HOSPITAL_BASED_OUTPATIENT_CLINIC_OR_DEPARTMENT_OTHER): Payer: Self-pay | Admitting: Nurse Practitioner

## 2023-12-20 DIAGNOSIS — Z780 Asymptomatic menopausal state: Secondary | ICD-10-CM

## 2023-12-20 DIAGNOSIS — Z1231 Encounter for screening mammogram for malignant neoplasm of breast: Secondary | ICD-10-CM
